# Patient Record
Sex: Male | Born: 1975
Health system: Southern US, Community
[De-identification: ages and names within clinical notes are randomized; demographics above are authoritative.]

## PROBLEM LIST (undated history)

## (undated) DIAGNOSIS — T7840XA Allergy, unspecified, initial encounter: Secondary | ICD-10-CM

## (undated) DIAGNOSIS — J189 Pneumonia, unspecified organism: Secondary | ICD-10-CM

## (undated) DIAGNOSIS — F419 Anxiety disorder, unspecified: Secondary | ICD-10-CM

## (undated) DIAGNOSIS — Z8701 Personal history of pneumonia (recurrent): Secondary | ICD-10-CM

## (undated) DIAGNOSIS — R748 Abnormal levels of other serum enzymes: Secondary | ICD-10-CM

## (undated) DIAGNOSIS — F329 Major depressive disorder, single episode, unspecified: Secondary | ICD-10-CM

## (undated) DIAGNOSIS — E119 Type 2 diabetes mellitus without complications: Secondary | ICD-10-CM

## (undated) DIAGNOSIS — C801 Malignant (primary) neoplasm, unspecified: Secondary | ICD-10-CM

## (undated) DIAGNOSIS — E785 Hyperlipidemia, unspecified: Secondary | ICD-10-CM

## (undated) DIAGNOSIS — F3181 Bipolar II disorder: Secondary | ICD-10-CM

## (undated) DIAGNOSIS — K219 Gastro-esophageal reflux disease without esophagitis: Secondary | ICD-10-CM

## (undated) DIAGNOSIS — F1021 Alcohol dependence, in remission: Secondary | ICD-10-CM

## (undated) DIAGNOSIS — J309 Allergic rhinitis, unspecified: Secondary | ICD-10-CM

## (undated) DIAGNOSIS — D649 Anemia, unspecified: Secondary | ICD-10-CM

## (undated) DIAGNOSIS — F32A Depression, unspecified: Secondary | ICD-10-CM

## (undated) DIAGNOSIS — I10 Essential (primary) hypertension: Secondary | ICD-10-CM

## (undated) HISTORY — DX: Allergy, unspecified, initial encounter: T78.40XA

## (undated) HISTORY — DX: Gastro-esophageal reflux disease without esophagitis: K21.9

## (undated) HISTORY — DX: Alcohol dependence, in remission: F10.21

## (undated) HISTORY — DX: Bipolar II disorder: F31.81

## (undated) HISTORY — DX: Anxiety disorder, unspecified: F41.9

## (undated) HISTORY — PX: SINUSOTOMY: SHX291

## (undated) HISTORY — PX: TONSILLECTOMY AND ADENOIDECTOMY: SHX28

## (undated) HISTORY — DX: Depression, unspecified: F32.A

## (undated) HISTORY — DX: Allergic rhinitis, unspecified: J30.9

## (undated) HISTORY — DX: Personal history of pneumonia (recurrent): Z87.01

## (undated) HISTORY — PX: OTHER SURGICAL HISTORY: SHX169

## (undated) HISTORY — PX: ESOPHAGOGASTRODUODENOSCOPY: SHX1529

## (undated) HISTORY — PX: WRIST FRACTURE SURGERY: SHX121

## (undated) HISTORY — DX: Hyperlipidemia, unspecified: E78.5

---
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
## 2010-11-26 DIAGNOSIS — K219 Gastro-esophageal reflux disease without esophagitis: Secondary | ICD-10-CM | POA: Insufficient documentation

## 2020-04-26 ENCOUNTER — Encounter: Payer: Self-pay | Admitting: Family Medicine

## 2020-04-27 ENCOUNTER — Other Ambulatory Visit: Payer: Self-pay

## 2020-04-28 ENCOUNTER — Ambulatory Visit: Payer: Self-pay | Admitting: Family Medicine

## 2020-04-30 ENCOUNTER — Other Ambulatory Visit: Payer: Self-pay

## 2020-04-30 ENCOUNTER — Emergency Department (HOSPITAL_COMMUNITY): Payer: BC Managed Care – PPO

## 2020-04-30 ENCOUNTER — Inpatient Hospital Stay (HOSPITAL_COMMUNITY)
Admission: EM | Admit: 2020-04-30 | Discharge: 2020-05-02 | DRG: 871 | Disposition: A | Discharge status: Home / Self Care | Payer: BC Managed Care – PPO | Source: Ambulatory Visit | Attending: Internal Medicine | Admitting: Internal Medicine

## 2020-04-30 DIAGNOSIS — N182 Chronic kidney disease, stage 2 (mild): Secondary | ICD-10-CM | POA: Diagnosis present

## 2020-04-30 DIAGNOSIS — A419 Sepsis, unspecified organism: Principal | ICD-10-CM | POA: Diagnosis present

## 2020-04-30 DIAGNOSIS — F17290 Nicotine dependence, other tobacco product, uncomplicated: Secondary | ICD-10-CM | POA: Diagnosis present

## 2020-04-30 DIAGNOSIS — E119 Type 2 diabetes mellitus without complications: Secondary | ICD-10-CM | POA: Diagnosis not present

## 2020-04-30 DIAGNOSIS — I129 Hypertensive chronic kidney disease with stage 1 through stage 4 chronic kidney disease, or unspecified chronic kidney disease: Secondary | ICD-10-CM | POA: Diagnosis present

## 2020-04-30 DIAGNOSIS — R7303 Prediabetes: Secondary | ICD-10-CM | POA: Diagnosis present

## 2020-04-30 DIAGNOSIS — R7989 Other specified abnormal findings of blood chemistry: Secondary | ICD-10-CM

## 2020-04-30 DIAGNOSIS — F419 Anxiety disorder, unspecified: Secondary | ICD-10-CM | POA: Diagnosis present

## 2020-04-30 DIAGNOSIS — J9601 Acute respiratory failure with hypoxia: Secondary | ICD-10-CM

## 2020-04-30 DIAGNOSIS — R6520 Severe sepsis without septic shock: Secondary | ICD-10-CM | POA: Diagnosis not present

## 2020-04-30 DIAGNOSIS — D649 Anemia, unspecified: Secondary | ICD-10-CM | POA: Diagnosis present

## 2020-04-30 DIAGNOSIS — K449 Diaphragmatic hernia without obstruction or gangrene: Secondary | ICD-10-CM | POA: Diagnosis present

## 2020-04-30 DIAGNOSIS — N289 Disorder of kidney and ureter, unspecified: Secondary | ICD-10-CM | POA: Diagnosis present

## 2020-04-30 DIAGNOSIS — Z888 Allergy status to other drugs, medicaments and biological substances status: Secondary | ICD-10-CM

## 2020-04-30 DIAGNOSIS — Z20822 Contact with and (suspected) exposure to covid-19: Secondary | ICD-10-CM | POA: Diagnosis present

## 2020-04-30 DIAGNOSIS — K219 Gastro-esophageal reflux disease without esophagitis: Secondary | ICD-10-CM | POA: Diagnosis present

## 2020-04-30 DIAGNOSIS — N179 Acute kidney failure, unspecified: Secondary | ICD-10-CM | POA: Diagnosis present

## 2020-04-30 DIAGNOSIS — F329 Major depressive disorder, single episode, unspecified: Secondary | ICD-10-CM | POA: Diagnosis present

## 2020-04-30 DIAGNOSIS — J189 Pneumonia, unspecified organism: Secondary | ICD-10-CM | POA: Diagnosis not present

## 2020-04-30 DIAGNOSIS — Z79899 Other long term (current) drug therapy: Secondary | ICD-10-CM

## 2020-04-30 DIAGNOSIS — J181 Lobar pneumonia, unspecified organism: Secondary | ICD-10-CM | POA: Diagnosis present

## 2020-04-30 DIAGNOSIS — I10 Essential (primary) hypertension: Secondary | ICD-10-CM | POA: Diagnosis present

## 2020-04-30 HISTORY — DX: Essential (primary) hypertension: I10

## 2020-04-30 LAB — CBC WITH DIFFERENTIAL/PLATELET
Abs Immature Granulocytes: 0.07 10*3/uL (ref 0.00–0.07)
Basophils Absolute: 0 10*3/uL (ref 0.0–0.1)
Basophils Relative: 0 %
Eosinophils Absolute: 0 10*3/uL (ref 0.0–0.5)
Eosinophils Relative: 0 %
HCT: 35.3 % — ABNORMAL LOW (ref 39.0–52.0)
Hemoglobin: 11.8 g/dL — ABNORMAL LOW (ref 13.0–17.0)
Immature Granulocytes: 1 %
Lymphocytes Relative: 5 %
Lymphs Abs: 0.7 10*3/uL (ref 0.7–4.0)
MCH: 27.9 pg (ref 26.0–34.0)
MCHC: 33.4 g/dL (ref 30.0–36.0)
MCV: 83.5 fL (ref 80.0–100.0)
Monocytes Absolute: 0.7 10*3/uL (ref 0.1–1.0)
Monocytes Relative: 5 %
Neutro Abs: 12.3 10*3/uL — ABNORMAL HIGH (ref 1.7–7.7)
Neutrophils Relative %: 89 %
Platelets: 235 10*3/uL (ref 150–400)
RBC: 4.23 MIL/uL (ref 4.22–5.81)
RDW: 15 % (ref 11.5–15.5)
WBC Morphology: INCREASED
WBC: 13.8 10*3/uL — ABNORMAL HIGH (ref 4.0–10.5)
nRBC: 0 % (ref 0.0–0.2)

## 2020-04-30 LAB — COMPREHENSIVE METABOLIC PANEL
ALT: 16 U/L (ref 0–44)
AST: 14 U/L — ABNORMAL LOW (ref 15–41)
Albumin: 4.3 g/dL (ref 3.5–5.0)
Alkaline Phosphatase: 38 U/L (ref 38–126)
Anion gap: 13 (ref 5–15)
BUN: 47 mg/dL — ABNORMAL HIGH (ref 6–20)
CO2: 27 mmol/L (ref 22–32)
Calcium: 9.8 mg/dL (ref 8.9–10.3)
Chloride: 103 mmol/L (ref 98–111)
Creatinine, Ser: 2.71 mg/dL — ABNORMAL HIGH (ref 0.61–1.24)
GFR calc Af Amer: 32 mL/min — ABNORMAL LOW (ref >60)
GFR calc non Af Amer: 27 mL/min — ABNORMAL LOW (ref >60)
Glucose, Bld: 105 mg/dL — ABNORMAL HIGH (ref 70–99)
Potassium: 3.9 mmol/L (ref 3.5–5.1)
Sodium: 143 mmol/L (ref 135–145)
Total Bilirubin: 0.8 mg/dL (ref 0.3–1.2)
Total Protein: 7.4 g/dL (ref 6.5–8.1)

## 2020-04-30 LAB — FIBRINOGEN: Fibrinogen: 596 mg/dL — ABNORMAL HIGH (ref 210–475)

## 2020-04-30 LAB — C-REACTIVE PROTEIN: CRP: 18.7 mg/dL — ABNORMAL HIGH (ref <1.0)

## 2020-04-30 LAB — SARS CORONAVIRUS 2 BY RT PCR (HOSPITAL ORDER, PERFORMED IN ~~LOC~~ HOSPITAL LAB): SARS Coronavirus 2: NEGATIVE

## 2020-04-30 LAB — TRIGLYCERIDES: Triglycerides: 39 mg/dL (ref <150)

## 2020-04-30 LAB — D-DIMER, QUANTITATIVE: D-Dimer, Quant: 0.9 ug/mL-FEU — ABNORMAL HIGH (ref 0.00–0.50)

## 2020-04-30 LAB — PROCALCITONIN: Procalcitonin: 1.05 ng/mL

## 2020-04-30 LAB — LACTATE DEHYDROGENASE: LDH: 112 U/L (ref 98–192)

## 2020-04-30 LAB — LACTIC ACID, PLASMA
Lactic Acid, Venous: 1.5 mmol/L (ref 0.5–1.9)
Lactic Acid, Venous: 1.1 mmol/L (ref 0.5–1.9)

## 2020-04-30 LAB — FERRITIN: Ferritin: 48 ng/mL (ref 24–336)

## 2020-04-30 MED ORDER — LACTATED RINGERS IV BOLUS (SEPSIS)
1000.0000 mL | Freq: Once | INTRAVENOUS | Status: AC
  Administered 2020-04-30: 1000 mL via INTRAVENOUS

## 2020-04-30 MED ORDER — LACTATED RINGERS IV BOLUS (SEPSIS): 500.0000 mL | Freq: Once | INTRAVENOUS | Status: DC

## 2020-04-30 MED ORDER — SODIUM CHLORIDE 0.9 % IV SOLN
500.0000 mg | INTRAVENOUS | Status: DC
  Administered 2020-04-30 – 2020-05-01 (×2): 500 mg via INTRAVENOUS
  Filled 2020-04-30 (×3): qty 500

## 2020-04-30 MED ORDER — DEXAMETHASONE SODIUM PHOSPHATE 10 MG/ML IJ SOLN
10.0000 mg | Freq: Once | INTRAMUSCULAR | Status: AC
  Administered 2020-04-30: 10 mg via INTRAVENOUS
  Filled 2020-04-30: qty 1

## 2020-04-30 MED ORDER — LORAZEPAM 2 MG/ML IJ SOLN
0.5000 mg | Freq: Once | INTRAMUSCULAR | Status: AC
  Administered 2020-04-30: 0.5 mg via INTRAVENOUS
  Filled 2020-04-30: qty 1

## 2020-04-30 MED ORDER — KETOROLAC TROMETHAMINE 15 MG/ML IJ SOLN
15.0000 mg | Freq: Once | INTRAMUSCULAR | Status: AC
  Administered 2020-04-30: 15 mg via INTRAVENOUS
  Filled 2020-04-30: qty 1

## 2020-04-30 MED ORDER — SODIUM CHLORIDE 0.9 % IV BOLUS
500.0000 mL | Freq: Once | INTRAVENOUS | Status: AC
  Administered 2020-04-30: 500 mL via INTRAVENOUS

## 2020-04-30 MED ORDER — SODIUM CHLORIDE 0.9 % IV BOLUS
1000.0000 mL | Freq: Once | INTRAVENOUS | Status: AC
  Administered 2020-04-30: 1000 mL via INTRAVENOUS

## 2020-04-30 MED ORDER — SODIUM CHLORIDE 0.9 % IV SOLN
2.0000 g | INTRAVENOUS | Status: DC
  Administered 2020-04-30 – 2020-05-01 (×2): 2 g via INTRAVENOUS
  Filled 2020-04-30: qty 20
  Filled 2020-04-30: qty 2
  Filled 2020-04-30: qty 20

## 2020-04-30 MED ORDER — LACTATED RINGERS IV BOLUS (SEPSIS)
1000.0000 mL | Freq: Once | INTRAVENOUS | Status: AC
  Administered 2020-05-01: 1000 mL via INTRAVENOUS

## 2020-04-30 NOTE — ED Provider Notes (Signed)
Kyle [name]   CSN: [id number] Arrival date & time: 04/30/20  1954     History No chief complaint on file.   Kyle [name] is a 44 y.o. male with history of GERD, hiatal hernia, hyperlipidemia, hypertension and prediabetes per his report presents brought in by EMS for evaluation of fever, cough, myalgias, generalized weakness.  When asked what brought him here he begins to tell me about his longstanding history with GERD related to peptic ulcer disease and hiatal hernia for which she has been told he needs Nissen fundoplication.  He states that it is not unusual for him to cough to the point that he vomits.  He does not remember going to the urgent care today but does tell me that his girlfriend told him that he had a fever of 101.2 F today.  He states that yesterday in the morning he overall felt well and spent time with his children, lay down for a nap and when he awoke "I had no energy and I was wiped out".  Per triage [name], the patient went to an urgent care today for nonproductive cough, nausea, and vomiting.  His O2 saturations dropped to 72% on room air.  Supplemental oxygen via nasal cannula was applied and he was transported to the ED for further evaluation and management.  The patient tells me that last Sunday he received the first dose of his Materna vaccine.  He has a longstanding history of tobacco use, used to smoke then switched to dip and now vapes.  The history is provided by the patient and the EMS personnel. The history is limited by the condition of the patient.       No past medical history on file.  There are no problems to display for this patient.   No family history on file.  Social History   Tobacco Use  . Smoking status: Not on file  Substance Use Topics  . Alcohol use: Not on file  . Drug use: Not on file    Home Medications Prior to Admission medications   Medication Sig Start Date End Date Taking?  Authorizing Provider  clonazePAM (KLONOPIN) 1 MG tablet Take 1 mg by mouth 2 (two) times daily. 04/28/20  Yes Provider, Historical, Kyle [name]  fenofibrate 160 MG tablet Take 160 mg by mouth daily. 04/13/20  Yes Provider, Historical, Kyle [name]  lisinopril-hydrochlorothiazide (ZESTORETIC) 10-12.5 MG tablet Take 1 tablet by mouth daily. 04/02/20  Yes Provider, Historical, Kyle [name]  pantoprazole (PROTONIX) 40 MG tablet Take 40 mg by mouth daily. 04/02/20  Yes Provider, Historical, Kyle [name]  QUEtiapine (SEROQUEL XR) 50 MG TB24 24 hr tablet Take 3 tablets by mouth at bedtime. 04/24/20  Yes Provider, Historical, Kyle [name]  venlafaxine XR (EFFEXOR-XR) 75 MG 24 hr capsule Take 3 capsules by mouth daily. 04/28/20  Yes Provider, Historical, Kyle [name]    Allergies    Benzonatate  Review of Systems   Review of Systems  Constitutional: Positive for chills, fatigue and fever.  Respiratory: Positive for cough and shortness of breath.   Gastrointestinal: Positive for nausea and vomiting. Negative for abdominal pain, constipation and diarrhea.  All other systems reviewed and are negative.   Physical Exam Updated Vital Signs BP (!) 86/64 (BP Location: Left Arm)   Pulse (!) 103   Temp 98.3 F (36.8 C) (Oral)   Resp (!) 30   Ht 6\' 2"  (1.88 m)   Wt 108.9 kg   SpO2 99%   BMI 30.81 kg/m  Physical Exam Vitals and nursing [name] reviewed.  Constitutional:      Appearance: He is well-developed. He is ill-appearing.  HENT:     Head: Normocephalic and atraumatic.  Eyes:     General:        Right eye: No discharge.        Left eye: No discharge.     Conjunctiva/sclera: Conjunctivae normal.  Neck:     Vascular: No JVD.     Trachea: No tracheal deviation.  Cardiovascular:     Rate and Rhythm: Regular rhythm. Tachycardia present.  Pulmonary:     Comments: Tachypneic.  Diffuse Rales.  SPO2 saturations 97% on 3.5LPM [place] Abdominal:     General: Bowel sounds are normal. There is no distension.     Palpations: Abdomen is soft.     Tenderness: There is  no abdominal tenderness. There is no guarding or rebound.  Musculoskeletal:     Cervical back: Neck supple. No rigidity or tenderness.  Skin:    General: Skin is warm.     Findings: No erythema.  Neurological:     Mental Status: He is alert.     Comments: Appears mildly confused, slow to answer questions.  No facial droop.  Moves all extremities spontaneous without difficulty.  Psychiatric:        Behavior: Behavior normal.     ED Results / Procedures / Treatments   Labs (all labs ordered are listed, but only abnormal results are displayed) Labs Reviewed  CBC WITH DIFFERENTIAL/PLATELET - Abnormal; Notable for the following components:      Result Value   WBC 13.8 (*)    Hemoglobin 11.8 (*)    HCT 35.3 (*)    Neutro Abs 12.3 (*)    All other components within normal limits  COMPREHENSIVE METABOLIC PANEL - Abnormal; Notable for the following components:   Glucose, Bld 105 (*)    BUN 47 (*)    Creatinine, Ser 2.71 (*)    AST 14 (*)    GFR calc non Af Amer 27 (*)    GFR calc Af Amer 32 (*)    All other components within normal limits  D-DIMER, QUANTITATIVE (NOT AT [facility]) - Abnormal; Notable for the following components:   D-Dimer, Quant 0.90 (*)    All other components within normal limits  FIBRINOGEN - Abnormal; Notable for the following components:   Fibrinogen 596 (*)    All other components within normal limits  SARS CORONAVIRUS 2 BY RT PCR (HOSPITAL ORDER, [facility])  CULTURE, BLOOD (ROUTINE X 2)  CULTURE, BLOOD (ROUTINE X 2)  LACTIC ACID, PLASMA  LACTATE DEHYDROGENASE  TRIGLYCERIDES  LACTIC ACID, PLASMA  PROCALCITONIN  FERRITIN  C-REACTIVE PROTEIN    EKG EKG Interpretation  Date/Time:  Friday April 30 2020 20:11:12 EDT Ventricular Rate:  107 PR Interval:    QRS Duration: 105 QT Interval:  323 QTC Calculation: 431 R Axis:   100 Text Interpretation: Sinus tachycardia Right axis deviation No STEMI Confirmed by Kyle [name]  [phone]) on 04/30/2020 9:03:11 PM   Radiology DG Chest Port 1 View  Result Date: 04/30/2020 CLINICAL DATA:  Shortness of breath and cough EXAM: PORTABLE CHEST 1 VIEW COMPARISON:  None. FINDINGS: Right lung is grossly clear. Patchy consolidations and ground-glass densities in the left mid to lower lung. Normal heart size. No pneumothorax. IMPRESSION: Patchy consolidations and ground-glass densities in the left mid to lower lung suspicious for pneumonia. Electronically Signed   By: Kyle Mercury  Francoise Ceo M.D.   On: 04/30/2020 20:52    Procedures .Critical Care Performed by: Kyle Papa, Kyle [name] Authorized by: Kyle Papa, Kyle [name]   Critical care provider statement:    Critical care time (minutes):  45   Critical care was necessary to treat or prevent imminent or life-threatening deterioration of the following conditions:  Respiratory failure   Critical care was time spent personally by me on the following activities:  Discussions with consultants, evaluation of patient's response to treatment, examination of patient, ordering and performing treatments and interventions, ordering and review of laboratory studies, ordering and review of radiographic studies, pulse oximetry, re-evaluation of patient's condition, obtaining history from patient or surrogate and review of old charts   (including critical care time)  Medications Ordered in ED Medications  lactated ringers bolus 1,000 mL (has no administration in time range)    And  lactated ringers bolus 1,000 mL (has no administration in time range)    And  lactated ringers bolus 1,000 mL (has no administration in time range)    And  lactated ringers bolus 500 mL (has no administration in time range)  cefTRIAXone (ROCEPHIN) 2 g in sodium chloride 0.9 % 100 mL IVPB (has no administration in time range)  azithromycin (ZITHROMAX) 500 mg in sodium chloride 0.9 % 250 mL IVPB (has no administration in time range)  sodium chloride 0.9 % bolus 500 mL (0 mLs  Intravenous Stopped 04/30/20 2201)  dexamethasone (DECADRON) injection 10 mg (10 mg Intravenous Given 04/30/20 2117)  LORazepam (ATIVAN) injection 0.5 mg (0.5 mg Intravenous Given 04/30/20 2158)  sodium chloride 0.9 % bolus 1,000 mL (1,000 mLs Intravenous New Bag/Given 04/30/20 2157)  ketorolac (TORADOL) 15 MG/ML injection 15 mg (15 mg Intravenous Given 04/30/20 2200)    ED Course  I have reviewed the triage vital signs and the nursing notes.  Pertinent labs & imaging results that were available during my care of the patient were reviewed by me and considered in my medical decision making (see chart for details).  Clinical Course as of Apr 30 2224  Fri Apr 30, 2020  2140 This is a 44 year old male is presenting from urgent care with concern for hypoxia and flulike illness.  The patient is a poor historian and cannot provide specifics for his illness.  He tells me that he has "felt like this way for years".  He said he went to the urgent care today because "I was tired of feeling like crap".  He denies any recent change in his symptoms.  At urgent care he was found to be hypoxic requiring 2 L nasal cannula.  His Covid test was "inconclusive".  He was advised to come to the ER.  Her exam here is stable on 2 L nasal cannula.  His respiratory rate is in the high 20s.  His heart rate is mildly tachycardic.  Xray shows bilateral PNA pattern consistent with covid. He is not vaccinated and I do feel this presentation is very consistent with covid.  PCR ordered.  We'll start fluids here and given decadron.  If covid is negative he'll need a full sepsis workup   [MT]    Clinical Course User Index [MT] Kyle [name], Kyle Rhine, Kyle [name]   MDM Rules/Calculators/A&P                          Kyle [name] was evaluated in Emergency Department on 04/30/2020 for the symptoms described in the history of present illness.  He was evaluated in the context of the global COVID-19 pandemic, which necessitated consideration that the patient  might be at risk for infection with the SARS-CoV-2 virus that causes COVID-19. Institutional protocols and algorithms that pertain to the evaluation of patients at risk for COVID-19 are in a state of rapid change based on information released by regulatory bodies including the CDC and federal and state organizations. These policies and algorithms were followed during the patient's care in the ED.  Patient presenting for evaluation of fever, myalgias, nausea, vomiting, cough.  Afebrile in the ED, tachycardic, tachypneic, and borderline hypotensive.  He appears ill.  Prior to this he was at urgent care where he was noted to be hypoxic and EMS transported him to the ED for further evaluation.  SPO2 saturations are stable on 3.5 L/min via nasal cannula.  Lungs with diffuse Rales concerning for possible pneumonia.  He got the first dose of his maternal vaccine last week.  He is a smoker and has multiple comorbidities.  Chest x-ray shows patchy consolidations and groundglass densities in the left mid to lower lung suspicious for pneumonia.  History and physical examination concerning for COVID-19 infection.  Will obtain lab work and Kyle [name] test.  10:00PM Signed out care to oncoming provider PA Kyle [name]; CBC shows leukocytosis, mild anemia, CMP shows renal insufficiency though unclear chronicity as the patient recently moved from [place] 1 month ago and we have no lab work in our system to compare to.  No metabolic derangements.  If Covid test is negative patient will likely need sepsis work-up, 30 cc/kg fluid bolus and IV antibiotics.  Patient was seen and evaluated by Kyle [name] who agrees with assessment and plan at this time.    Final Clinical Impression(s) / ED Diagnoses Final diagnoses:  Acute respiratory failure with hypoxia [facility])  Renal insufficiency  Multifocal pneumonia    Rx / DC Orders ED Discharge Orders    None       Debroah Baller 04/30/20 2229    Wyvonnia Dusky,  Kyle [name] 05/01/20 [phone]

## 2020-04-30 NOTE — ED Triage Notes (Signed)
Pt arrives EMS after going to UC for non productive cough, nausea and vomiting. Pt received a rapid covid test that resulted inconclusive. Per UC pt oxygen saturation dropped to 72% on room air. Applied 2L [place] and it improved to 90%.

## 2020-04-30 NOTE — ED Notes (Signed)
Patient states that he is here for nausea, vomiting and a cough.

## 2020-04-30 NOTE — ED Notes (Signed)
Family updated as to patient's status.

## 2020-04-30 NOTE — ED Provider Notes (Signed)
Assumed care from PA [facility] at shift change.  See prior notes for full H&P.  Briefly, 44 y.o. M here for cough, fever, myalgias, and generalized weakness.  He went to UC earlier today and sats were 72% on RA.  He has been on 3.5L here in the ED and maintaining into 90's.  He is not generally O2 dependent.  First dose of moderna given.   CXR with findings of multifocal pneumonia.  Plan:  If covid negative, will start abx.  Will require admission either way given O2 requirement.  10:24 PM covid is negative.  Code sepsis has been activated as patient does remain hypotensive after initial IVFB.  Last BP reading 86/64, HR 103.  He was started on rocephin and azithromycin, give 30cc/kg bolus.  11:04 PM BP improving with IVF, around 670 systolic now.  Will admit to hospitalist service.  Discussed with Dr. Hal Hope-- he will admit for ongoing care.  CRITICAL CARE Performed by: Larene Pickett   Total critical care time: 35 minutes  Critical care time was exclusive of separately billable procedures and treating other patients.  Critical care was necessary to treat or prevent imminent or life-threatening deterioration.  Critical care was time spent personally by me on the following activities: development of treatment plan with patient and/or surrogate as well as nursing, discussions with consultants, evaluation of patient's response to treatment, examination of patient, obtaining history from patient or surrogate, ordering and performing treatments and interventions, ordering and review of laboratory studies, ordering and review of radiographic studies, pulse oximetry and re-evaluation of patient's condition.    Larene Pickett, PA-C 05/01/20 1410    Wyvonnia Dusky, MD 05/01/20 [phone]

## 2020-05-01 ENCOUNTER — Encounter (HOSPITAL_COMMUNITY): Payer: Self-pay | Admitting: Internal Medicine

## 2020-05-01 ENCOUNTER — Inpatient Hospital Stay (HOSPITAL_COMMUNITY): Payer: BC Managed Care – PPO

## 2020-05-01 DIAGNOSIS — R6520 Severe sepsis without septic shock: Secondary | ICD-10-CM

## 2020-05-01 DIAGNOSIS — N179 Acute kidney failure, unspecified: Secondary | ICD-10-CM

## 2020-05-01 DIAGNOSIS — I10 Essential (primary) hypertension: Secondary | ICD-10-CM | POA: Diagnosis present

## 2020-05-01 DIAGNOSIS — J189 Pneumonia, unspecified organism: Secondary | ICD-10-CM

## 2020-05-01 DIAGNOSIS — J9601 Acute respiratory failure with hypoxia: Secondary | ICD-10-CM

## 2020-05-01 DIAGNOSIS — J181 Lobar pneumonia, unspecified organism: Secondary | ICD-10-CM

## 2020-05-01 DIAGNOSIS — A419 Sepsis, unspecified organism: Secondary | ICD-10-CM | POA: Diagnosis present

## 2020-05-01 LAB — URINALYSIS, ROUTINE W REFLEX MICROSCOPIC
Bilirubin Urine: NEGATIVE
Glucose, UA: 150 mg/dL — AB
Hgb urine dipstick: NEGATIVE
Ketones, ur: NEGATIVE mg/dL
Leukocytes,Ua: NEGATIVE
Nitrite: NEGATIVE
Protein, ur: NEGATIVE mg/dL
Specific Gravity, Urine: 1.014 (ref 1.005–1.030)
pH: 5 (ref 5.0–8.0)

## 2020-05-01 LAB — RESPIRATORY PANEL BY PCR

## 2020-05-01 LAB — COMPREHENSIVE METABOLIC PANEL
ALT: 20 U/L (ref 0–44)
AST: 18 U/L (ref 15–41)
Albumin: 3.3 g/dL — ABNORMAL LOW (ref 3.5–5.0)
Alkaline Phosphatase: 30 U/L — ABNORMAL LOW (ref 38–126)
Anion gap: 11 (ref 5–15)
BUN: 39 mg/dL — ABNORMAL HIGH (ref 6–20)
CO2: 20 mmol/L — ABNORMAL LOW (ref 22–32)
Calcium: 8.1 mg/dL — ABNORMAL LOW (ref 8.9–10.3)
Chloride: 105 mmol/L (ref 98–111)
Creatinine, Ser: 2.3 mg/dL — ABNORMAL HIGH (ref 0.61–1.24)
GFR calc Af Amer: 39 mL/min — ABNORMAL LOW (ref >60)
GFR calc non Af Amer: 33 mL/min — ABNORMAL LOW (ref >60)
Glucose, Bld: 185 mg/dL — ABNORMAL HIGH (ref 70–99)
Potassium: 4.3 mmol/L (ref 3.5–5.1)
Sodium: 136 mmol/L (ref 135–145)
Total Bilirubin: 0.3 mg/dL (ref 0.3–1.2)
Total Protein: 6.4 g/dL — ABNORMAL LOW (ref 6.5–8.1)

## 2020-05-01 LAB — CBC WITH DIFFERENTIAL/PLATELET
Abs Immature Granulocytes: 0.03 10*3/uL (ref 0.00–0.07)
Abs Immature Granulocytes: 0.04 10*3/uL (ref 0.00–0.07)
Abs Immature Granulocytes: 0.05 10*3/uL (ref 0.00–0.07)
Basophils Absolute: 0 10*3/uL (ref 0.0–0.1)
Basophils Absolute: 0 10*3/uL (ref 0.0–0.1)
Basophils Absolute: 0 10*3/uL (ref 0.0–0.1)
Basophils Relative: 0 %
Basophils Relative: 0 %
Basophils Relative: 0 %
Eosinophils Absolute: 0 10*3/uL (ref 0.0–0.5)
Eosinophils Absolute: 0 10*3/uL (ref 0.0–0.5)
Eosinophils Absolute: 0 10*3/uL (ref 0.0–0.5)
Eosinophils Relative: 0 %
Eosinophils Relative: 0 %
Eosinophils Relative: 0 %
HCT: 30.9 % — ABNORMAL LOW (ref 39.0–52.0)
HCT: 31.1 % — ABNORMAL LOW (ref 39.0–52.0)
HCT: 31.4 % — ABNORMAL LOW (ref 39.0–52.0)
Hemoglobin: 10.1 g/dL — ABNORMAL LOW (ref 13.0–17.0)
Hemoglobin: 10.3 g/dL — ABNORMAL LOW (ref 13.0–17.0)
Hemoglobin: 10.2 g/dL — ABNORMAL LOW (ref 13.0–17.0)
Immature Granulocytes: 0 %
Immature Granulocytes: 0 %
Immature Granulocytes: 0 %
Lymphocytes Relative: 3 %
Lymphocytes Relative: 4 %
Lymphocytes Relative: 5 %
Lymphs Abs: 0.5 10*3/uL — ABNORMAL LOW (ref 0.7–4.0)
Lymphs Abs: 0.5 10*3/uL — ABNORMAL LOW (ref 0.7–4.0)
Lymphs Abs: 0.8 10*3/uL (ref 0.7–4.0)
MCH: 27.8 pg (ref 26.0–34.0)
MCH: 27.9 pg (ref 26.0–34.0)
MCH: 27.5 pg (ref 26.0–34.0)
MCHC: 32.7 g/dL (ref 30.0–36.0)
MCHC: 33.1 g/dL (ref 30.0–36.0)
MCHC: 32.5 g/dL (ref 30.0–36.0)
MCV: 85.1 fL (ref 80.0–100.0)
MCV: 84.3 fL (ref 80.0–100.0)
MCV: 84.6 fL (ref 80.0–100.0)
Monocytes Absolute: 0.2 10*3/uL (ref 0.1–1.0)
Monocytes Absolute: 0.3 10*3/uL (ref 0.1–1.0)
Monocytes Absolute: 0.9 10*3/uL (ref 0.1–1.0)
Monocytes Relative: 2 %
Monocytes Relative: 2 %
Monocytes Relative: 6 %
Neutro Abs: 13.1 10*3/uL — ABNORMAL HIGH (ref 1.7–7.7)
Neutro Abs: 12.6 10*3/uL — ABNORMAL HIGH (ref 1.7–7.7)
Neutro Abs: 13.6 10*3/uL — ABNORMAL HIGH (ref 1.7–7.7)
Neutrophils Relative %: 95 %
Neutrophils Relative %: 94 %
Neutrophils Relative %: 89 %
Platelets: 212 10*3/uL (ref 150–400)
Platelets: 211 10*3/uL (ref 150–400)
Platelets: 230 10*3/uL (ref 150–400)
RBC: 3.63 MIL/uL — ABNORMAL LOW (ref 4.22–5.81)
RBC: 3.69 MIL/uL — ABNORMAL LOW (ref 4.22–5.81)
RBC: 3.71 MIL/uL — ABNORMAL LOW (ref 4.22–5.81)
RDW: 15.1 % (ref 11.5–15.5)
RDW: 15 % (ref 11.5–15.5)
RDW: 15.1 % (ref 11.5–15.5)
WBC: 13.9 10*3/uL — ABNORMAL HIGH (ref 4.0–10.5)
WBC: 13.4 10*3/uL — ABNORMAL HIGH (ref 4.0–10.5)
WBC: 15.3 10*3/uL — ABNORMAL HIGH (ref 4.0–10.5)
nRBC: 0 % (ref 0.0–0.2)
nRBC: 0 % (ref 0.0–0.2)
nRBC: 0 % (ref 0.0–0.2)

## 2020-05-01 LAB — HIV ANTIBODY (ROUTINE TESTING W REFLEX): HIV Screen 4th Generation wRfx: NONREACTIVE

## 2020-05-01 LAB — CREATININE, URINE, RANDOM: Creatinine, Urine: 169.67 mg/dL

## 2020-05-01 LAB — LIPASE, BLOOD: Lipase: 22 U/L (ref 11–51)

## 2020-05-01 LAB — MRSA PCR SCREENING: MRSA by PCR: NEGATIVE

## 2020-05-01 LAB — SODIUM, URINE, RANDOM: Sodium, Ur: 29 mmol/L

## 2020-05-01 LAB — PROTIME-INR
INR: 1.3 — ABNORMAL HIGH (ref 0.8–1.2)
Prothrombin Time: 15.3 seconds — ABNORMAL HIGH (ref 11.4–15.2)

## 2020-05-01 LAB — APTT: aPTT: 31 seconds (ref 24–36)

## 2020-05-01 LAB — SARS CORONAVIRUS 2 BY RT PCR (HOSPITAL ORDER, PERFORMED IN ~~LOC~~ HOSPITAL LAB): SARS Coronavirus 2: NEGATIVE

## 2020-05-01 LAB — STREP PNEUMONIAE URINARY ANTIGEN: Strep Pneumo Urinary Antigen: NEGATIVE

## 2020-05-01 MED ORDER — LORATADINE 10 MG PO TABS: 10.0000 mg | ORAL_TABLET | Freq: Every day | ORAL | Status: DC

## 2020-05-01 MED ORDER — VENLAFAXINE HCL ER 75 MG PO CP24
225.0000 mg | ORAL_CAPSULE | Freq: Every day | ORAL | Status: DC
  Administered 2020-05-01: 225 mg via ORAL
  Filled 2020-05-01: qty 1

## 2020-05-01 MED ORDER — SODIUM CHLORIDE 0.9 % IV SOLN: INTRAVENOUS | Status: DC

## 2020-05-01 MED ORDER — CLONAZEPAM 1 MG PO TABS: 1.0000 mg | ORAL_TABLET | Freq: Two times a day (BID) | ORAL | Status: DC

## 2020-05-01 MED ORDER — QUETIAPINE FUMARATE ER 50 MG PO TB24: 150.0000 mg | ORAL_TABLET | Freq: Every day | ORAL | Status: DC

## 2020-05-01 MED ORDER — CHLORHEXIDINE GLUCONATE CLOTH 2 % EX PADS: 6.0000 | MEDICATED_PAD | Freq: Every day | CUTANEOUS | Status: DC

## 2020-05-01 MED ORDER — ONDANSETRON HCL 4 MG/2ML IJ SOLN: 4.0000 mg | Freq: Four times a day (QID) | INTRAMUSCULAR | Status: DC | PRN

## 2020-05-01 MED ORDER — GUAIFENESIN ER 600 MG PO TB12
600.0000 mg | ORAL_TABLET | Freq: Two times a day (BID) | ORAL | Status: DC
  Administered 2020-05-01 (×2): 600 mg via ORAL
  Filled 2020-05-01 (×3): qty 1

## 2020-05-01 MED ORDER — ONDANSETRON HCL 4 MG PO TABS: 4.0000 mg | ORAL_TABLET | Freq: Four times a day (QID) | ORAL | Status: DC | PRN

## 2020-05-01 MED ORDER — SODIUM BICARBONATE 650 MG PO TABS
650.0000 mg | ORAL_TABLET | Freq: Two times a day (BID) | ORAL | Status: DC
  Administered 2020-05-01 – 2020-05-02 (×2): 650 mg via ORAL
  Filled 2020-05-01 (×3): qty 1

## 2020-05-01 MED ORDER — PANTOPRAZOLE SODIUM 40 MG PO TBEC
40.0000 mg | DELAYED_RELEASE_TABLET | Freq: Every day | ORAL | Status: DC
  Administered 2020-05-01 – 2020-05-02 (×2): 40 mg via ORAL
  Filled 2020-05-01 (×2): qty 1

## 2020-05-01 MED ORDER — HYDRALAZINE HCL 20 MG/ML IJ SOLN
10.0000 mg | INTRAMUSCULAR | Status: DC | PRN
  Administered 2020-05-01 (×2): 10 mg via INTRAVENOUS
  Filled 2020-05-01 (×2): qty 1

## 2020-05-01 MED ORDER — QUETIAPINE FUMARATE ER 50 MG PO TB24
150.0000 mg | ORAL_TABLET | Freq: Every day | ORAL | Status: DC
  Administered 2020-05-01: 22:00:00 150 mg via ORAL
  Filled 2020-05-01 (×2): qty 3

## 2020-05-01 MED ORDER — VENLAFAXINE HCL ER 75 MG PO CP24: 225.0000 mg | ORAL_CAPSULE | Freq: Every day | ORAL | Status: DC

## 2020-05-01 MED ORDER — CLONAZEPAM 1 MG PO TABS
1.0000 mg | ORAL_TABLET | Freq: Two times a day (BID) | ORAL | Status: DC
  Administered 2020-05-01 – 2020-05-02 (×3): 1 mg via ORAL
  Filled 2020-05-01 (×3): qty 1

## 2020-05-01 MED ORDER — SUCRALFATE 1 G PO TABS
1.0000 g | ORAL_TABLET | Freq: Three times a day (TID) | ORAL | Status: DC
  Administered 2020-05-01 – 2020-05-02 (×2): 1 g via ORAL
  Filled 2020-05-01 (×2): qty 1

## 2020-05-01 MED ORDER — ENOXAPARIN SODIUM 40 MG/0.4ML ~~LOC~~ SOLN
40.0000 mg | SUBCUTANEOUS | Status: DC
  Administered 2020-05-01: 40 mg via SUBCUTANEOUS
  Filled 2020-05-01: qty 0.4

## 2020-05-01 MED ORDER — LACTATED RINGERS IV SOLN: INTRAVENOUS | Status: DC

## 2020-05-01 MED ORDER — CARVEDILOL 3.125 MG PO TABS
3.1250 mg | ORAL_TABLET | Freq: Two times a day (BID) | ORAL | Status: DC
  Administered 2020-05-01 – 2020-05-02 (×2): 3.125 mg via ORAL
  Filled 2020-05-01 (×2): qty 1

## 2020-05-01 MED ORDER — FENOFIBRATE 160 MG PO TABS
160.0000 mg | ORAL_TABLET | Freq: Every day | ORAL | Status: DC
  Administered 2020-05-01 – 2020-05-02 (×2): 160 mg via ORAL
  Filled 2020-05-01 (×2): qty 1

## 2020-05-01 MED ORDER — LORATADINE 10 MG PO TABS
10.0000 mg | ORAL_TABLET | Freq: Every day | ORAL | Status: DC
  Administered 2020-05-02: 10 mg via ORAL
  Filled 2020-05-01: qty 1

## 2020-05-01 NOTE — Plan of Care (Signed)
  Problem: Clinical Measurements: Goal: Cardiovascular complication will be avoided Outcome: Progressing   

## 2020-05-01 NOTE — H&P (Signed)
History and Physical    Vanden Fawaz FXT:[id number] DOB: Jun 24, 1976 DOA: 04/30/2020  PCP: Libby Maw, MD  Patient coming from: Home.  Chief Complaint: Shortness of breath nausea vomiting.  HPI: Kyle [name] is a 44 y.o. male with history of hypertension and depression was experiencing intractable nausea vomiting for the last 24 hours and went to check on this to the urgent care over there patient was hypoxic.  Patient also subsequently started having shortness of breath with some cough.  Denies chest pain.  Denies abdominal pain or diarrhea.  Had no fever or chills.  No recent change in medications or recent travel.  ED Course: In the ER patient was hypotensive with blood pressure in the 80s.  Was requiring 2 L oxygen to maintain sats.  Patient was given fluid bolus for sepsis protocol and blood cultures obtained and started on antibiotics for possible pneumonia since chest x-ray was showing infiltrates.  Patient's labs show CRP of 18.7 procalcitonin 1.05 creatinine of 2.7 no old 1 to compare.  Hemoglobin 11.8.  WBC count of 13.8.  LFTs are normal.  Abdomen appears benign.  Patient admitted for developing sepsis likely from pneumonia.  Review of Systems: As per HPI, rest all negative.   Past Medical History:  Diagnosis Date  . Hypertension     History reviewed. No pertinent surgical history.   reports that he has never smoked. He has never used smokeless tobacco. No history on file for alcohol use and drug use.  Allergies  Allergen Reactions  . Benzonatate Other (See Comments)    Family History  Family history unknown: Yes    Prior to Admission medications   Medication Sig Start Date End Date Taking? Authorizing Provider  clonazePAM (KLONOPIN) 1 MG tablet Take 1 mg by mouth 2 (two) times daily. 04/28/20  Yes [provider]  fenofibrate 160 MG tablet Take 160 mg by mouth daily. 04/13/20  Yes [provider]  lisinopril-hydrochlorothiazide (ZESTORETIC)  10-12.5 MG tablet Take 1 tablet by mouth daily. 04/02/20  Yes [provider]  pantoprazole (PROTONIX) 40 MG tablet Take 40 mg by mouth daily. 04/02/20  Yes [provider]  QUEtiapine (SEROQUEL XR) 50 MG TB24 24 hr tablet Take 3 tablets by mouth at bedtime. 04/24/20  Yes [provider]  venlafaxine XR (EFFEXOR-XR) 75 MG 24 hr capsule Take 3 capsules by mouth daily. 04/28/20  Yes [provider]    Physical Exam: Constitutional: Moderately built and nourished. Vitals:   04/30/20 2301 04/30/20 2317 05/01/20 0003 05/01/20 0058  BP: (!) 99/57 (!) 99/57 (!) 97/54 (!) 96/55  Pulse: 93 97 88 81  Resp: 20  18 18   Temp:    (!) 97.3 F (36.3 C)  TempSrc:    Oral  SpO2:   94% 95%  Weight:      Height:       Eyes: Anicteric no pallor. ENMT: No discharge from the ears eyes nose or mouth. Neck: No mass felt.  No neck rigidity. Respiratory: No rhonchi or crepitations. Cardiovascular: S1-S2 heard. Abdomen: Soft nontender bowel sounds present. Musculoskeletal: No edema.  No joint effusion. Skin: No rash. Neurologic: Alert awake oriented to time place and person.  Moves all extremities. Psychiatric: Appears normal.  Normal affect.   Labs on Admission: I have personally reviewed following labs and imaging studies  CBC: Recent Labs  Lab 04/30/20 2055  WBC 13.8*  NEUTROABS 12.3*  HGB 11.8*  HCT 35.3*  MCV 83.5  PLT 235  Basic Metabolic Panel: Recent Labs  Lab 04/30/20 2055  NA 143  K 3.9  CL 103  CO2 27  GLUCOSE 105*  BUN 47*  CREATININE 2.71*  CALCIUM 9.8   GFR: Estimated Creatinine Clearance: 45.7 mL/min (A) (by C-G formula based on SCr of 2.71 mg/dL (H)). Liver Function Tests: Recent Labs  Lab 04/30/20 2055  AST 14*  ALT 16  ALKPHOS 38  BILITOT 0.8  PROT 7.4  ALBUMIN 4.3   No results for input(s): LIPASE, AMYLASE in the last 168 hours. No results for input(s): AMMONIA in the last 168 hours. Coagulation Profile: No results for  input(s): INR, PROTIME in the last 168 hours. Cardiac Enzymes: No results for input(s): CKTOTAL, CKMB, CKMBINDEX, TROPONINI in the last 168 hours. BNP (last 3 results) No results for input(s): PROBNP in the last 8760 hours. HbA1C: No results for input(s): HGBA1C in the last 72 hours. CBG: No results for input(s): GLUCAP in the last 168 hours. Lipid Profile: Recent Labs    04/30/20 2055  TRIG 39   Thyroid Function Tests: No results for input(s): TSH, T4TOTAL, FREET4, T3FREE, THYROIDAB in the last 72 hours. Anemia Panel: Recent Labs    04/30/20 2055  FERRITIN 48   Urine analysis: No results found for: COLORURINE, APPEARANCEUR, LABSPEC, PHURINE, GLUCOSEU, HGBUR, BILIRUBINUR, KETONESUR, PROTEINUR, UROBILINOGEN, NITRITE, LEUKOCYTESUR Sepsis Labs: @LABRCNTIP (procalcitonin:4,lacticidven:4) ) Recent Results (from the past 240 hour(s))  SARS Coronavirus 2 by RT PCR (hospital order, performed in [facility] hospital lab) Nasopharyngeal Nasopharyngeal Swab     Status: None   Collection Time: 04/30/20  8:36 PM   Specimen: Nasopharyngeal Swab  Result Value Ref Range Status   SARS Coronavirus 2 NEGATIVE NEGATIVE Final    Comment: (NOTE) SARS-CoV-2 target nucleic acids are NOT DETECTED.  The SARS-CoV-2 RNA is generally detectable in upper and lower respiratory specimens during the acute phase of infection. The lowest concentration of SARS-CoV-2 viral copies this assay can detect is 250 copies / mL. A negative result does not preclude SARS-CoV-2 infection and should not be used as the sole basis for treatment or other patient management decisions.  A negative result may occur with improper specimen collection / handling, submission of specimen other than nasopharyngeal swab, presence of viral mutation(s) within the areas targeted by this assay, and inadequate number of viral copies (<250 copies / mL). A negative result must be combined with clinical observations, patient history, and  epidemiological information.  Fact Sheet for Patients:   StrictlyIdeas.no  Fact Sheet for Healthcare Providers: BankingDealers.co.za  This test is not yet approved or  cleared by the [place] FDA and has been authorized for detection and/or diagnosis of SARS-CoV-2 by FDA under an Emergency Use Authorization (EUA).  This EUA will remain in effect (meaning this test can be used) for the duration of the COVID-19 declaration under Section 564(b)(1) of the Act, 21 U.S.C. section 360bbb-3(b)(1), unless the authorization is terminated or revoked sooner.  Performed at [facility], [place] [address], [place], [place] [address]      Radiological Exams on Admission: DG Chest Port 1 View  Result Date: 04/30/2020 CLINICAL DATA:  Shortness of breath and cough EXAM: PORTABLE CHEST 1 VIEW COMPARISON:  None. FINDINGS: Right lung is grossly clear. Patchy consolidations and ground-glass densities in the left mid to lower lung. Normal heart size. No pneumothorax. IMPRESSION: Patchy consolidations and ground-glass densities in the left mid to lower lung suspicious for pneumonia. Electronically Signed   By: Madie Reno.D.  On: 04/30/2020 20:52    EKG: Independently reviewed.  Sinus tachycardia.  Assessment/Plan Principal Problem:   Sepsis ([place]) Active Problems:   Acute respiratory failure with hypoxia (HCC)   Essential hypertension    1. Sepsis likely from pneumonia for which patient is on empiric antibiotics for community-acquired pneumonia.  Since there was some concern for COVID-19 infection risk for stone was negative we will repeat another 1 after few hours.  Follow cultures check urine for Legionella strep antigen.  Continue with aggressive hydration. 2. Nausea vomiting cause not clear.  Abdomen appears benign LFTs are normal will check lipase.  Could be from pneumonia.  If there is any further nausea vomiting will scan  abdomen. 3. Acute respiratory failure with hypoxia secondary to pneumonia. 4. History of hypertension presently holding antihypertensives due to septic picture.  As needed IV hydralazine for systolic blood pressure more than 160.  Patient usually takes lisinopril and hydrochlorothiazide which is on hold. 5. History of depression on Seroquel and venlafaxine. 6. Acute renal failure with no old labs to compare.  Check urine studies.  Continue hydration hold lisinopril hydrochlorothiazide. 7. Anemia  -follow CBC check anemia panel with next blood draw.  Patient will need further studies on this as outpatient if there is no significant fall in hemoglobin during inpatient.  Since patient has septic picture will need inpatient status.   DVT prophylaxis: Lovenox. Code Status: Full code. Family Communication: Discussed with patient. Disposition Plan: Home. Consults called: None. Admission status: Inpatient.   Rise Patience MD [facility] Pager [phone].  If 7PM-7AM, please contact night-coverage [email or web address] Password [email or web address]  05/01/2020, 1:12 AM

## 2020-05-01 NOTE — Progress Notes (Addendum)
PROGRESS NOTE    Kyle [name]  XNT:[id number] DOB: 1975-11-30 DOA: 04/30/2020 PCP: Libby Maw, MD    No chief complaint on file.   Brief Narrative:  PCP: Libby Maw, MD  Patient coming from: Home.  Chief Complaint: Shortness of breath nausea vomiting.  HPI: Kyle [name] is a 44 y.o. male with history of hypertension and depression was experiencing intractable nausea vomiting for the last 24 hours and went to check on this to the urgent care over there patient was hypoxic.  Patient also subsequently started having shortness of breath with some cough.  Denies chest pain.  Denies abdominal pain or diarrhea.  Had no fever or chills.  No recent change in medications or recent travel.  ED Course: In the ER patient was hypotensive with blood pressure in the 80s.  Was requiring 2 L oxygen to maintain sats.  Patient was given fluid bolus for sepsis protocol and blood cultures obtained and started on antibiotics for possible pneumonia since chest x-ray was showing infiltrates.  Patient's labs show CRP of 18.7 procalcitonin 1.05 creatinine of 2.7 no old 1 to compare.  Hemoglobin 11.8.  WBC count of 13.8.  LFTs are normal.  Abdomen appears benign.  Patient admitted for developing sepsis likely from pneumonia.  Subjective:  He is anxious , denies pain, he is on room air at rest   Assessment & Plan:   Principal Problem:   Sepsis ([place]) Active Problems:   Acute respiratory failure with hypoxia ([place])   Essential hypertension   Lobar pneumonia with severe sepsis/acute hypoxic respiratory failure present on admission -He was tachypneic respiratory rate 30, tachycardia heart rate 107, hypotensive systolic blood pressure 96/75, leukocytosis WBC 13.8, with greater than 20%  Bandemia, acute hypoxic respiratory failure (72% on room air per EMS report) on presentation -cxr: "Patchy consolidations and ground-glass densities in the left mid to lower lung suspicious for  pneumonia" -Respiratory viral panel negative, Covid screening negative, blood culture no growth, urine Legionella  antigen is pending, strep pneumo antigen is negative -He reports nausea and vomiting at home, suspect aspiration pneumonia versus community acquired pneumonia -Continue Rocephin and Zithromax, wean oxygen ,will check ambulating pulse ox tomorrow  Nausea vomiting patient report he has hiatal hernia, some time has nausea vomiting, he is in the process of getting fundoplication -No nausea vomiting in the hospital  Elevated cr, unknown baseline creatinine, but suspect component of acute AKI due to sepsis, continue hydration, repeat BMP in the morning, renal dosing meds Hold lisinopril and ACE HCTZ  Normocytic anemia Hemoglobin above 10, no sign of bleeding  HTN: He agreed to start Coreg  Elevated blood glucose We will check A1c, no reported history of diabetes  History of anxiety and depression continue Seroquel Effexor and scheduled Klonopin   Body mass index is 30.51 kg/m.    DVT prophylaxis: enoxaparin (LOVENOX) injection 40 mg Start: 05/01/20 1000   Code Status: Full Family Communication: Patient Disposition:   Status is: Inpatient   Dispo: The patient is from: home              Anticipated d/c is to: home              Anticipated d/c date is: 24-48hrs              transfer out of stepdown to med surg, Patient currently needing ivf for aki, needing abx for pneumonia, wean oxygen  Consultants:   none  Procedures:   none  Antimicrobials:  Rocephin/zitrho     Objective: Vitals:   05/01/20 0600 05/01/20 0800 05/01/20 0900 05/01/20 1000  BP: 127/72 (!) 153/91 (!) 155/113 (!) 150/88  Pulse: 89 88 (!) 104 100  Resp: 20 19 19  (!) 23  Temp: 97.8 F (36.6 C)     TempSrc: Oral     SpO2: 96% 97% 100% 100%  Weight:      Height:        Intake/Output Summary (Last 24 hours) at 05/01/2020 1032 Last data filed at 05/01/2020 0700 Gross per 24 hour   Intake 7025.3 ml  Output 500 ml  Net 6525.3 ml   Filed Weights   04/30/20 2025 05/01/20 0240  Weight: 108.9 kg 107.8 kg    Examination:  General exam: Anxious Respiratory system: Clear to auscultation. Respiratory effort normal. Cardiovascular system: S1 & S2 heard, RRR. No JVD, no murmur, No pedal edema. Gastrointestinal system: Abdomen is nondistended, soft and nontender. No organomegaly or masses felt. Normal bowel sounds heard. Central nervous system: Alert and oriented. No focal neurological deficits. Extremities: Symmetric 5 x 5 power. Skin: No rashes, lesions or ulcers Psychiatry: Anxious    Data Reviewed: I have personally reviewed following labs and imaging studies  CBC: Recent Labs  Lab 04/30/20 2055 05/01/20 0234  WBC 13.8* 13.9*  NEUTROABS 12.3* 13.1*  HGB 11.8* 10.1*  HCT 35.3* 30.9*  MCV 83.5 85.1  PLT 235 401    Basic Metabolic Panel: Recent Labs  Lab 04/30/20 2055 05/01/20 0234  NA 143 136  K 3.9 4.3  CL 103 105  CO2 27 20*  GLUCOSE 105* 185*  BUN 47* 39*  CREATININE 2.71* 2.30*  CALCIUM 9.8 8.1*    GFR: Estimated Creatinine Clearance: 53.6 mL/min (A) (by C-G formula based on SCr of 2.3 mg/dL (H)).  Liver Function Tests: Recent Labs  Lab 04/30/20 2055 05/01/20 0234  AST 14* 18  ALT 16 20  ALKPHOS 38 30*  BILITOT 0.8 0.3  PROT 7.4 6.4*  ALBUMIN 4.3 3.3*    CBG: No results for input(s): GLUCAP in the last 168 hours.   Recent Results (from the past 240 hour(s))  SARS Coronavirus 2 by RT PCR (hospital order, performed in [facility] hospital lab) Nasopharyngeal Nasopharyngeal Swab     Status: None   Collection Time: 04/30/20  8:36 PM   Specimen: Nasopharyngeal Swab  Result Value Ref Range Status   SARS Coronavirus 2 NEGATIVE NEGATIVE Final    Comment: (NOTE) SARS-CoV-2 target nucleic acids are NOT DETECTED.  The SARS-CoV-2 RNA is generally detectable in upper and lower respiratory specimens during the acute phase of  infection. The lowest concentration of SARS-CoV-2 viral copies this assay can detect is 250 copies / mL. A negative result does not preclude SARS-CoV-2 infection and should not be used as the sole basis for treatment or other patient management decisions.  A negative result may occur with improper specimen collection / handling, submission of specimen other than nasopharyngeal swab, presence of viral mutation(s) within the areas targeted by this assay, and inadequate number of viral copies (<250 copies / mL). A negative result must be combined with clinical observations, patient history, and epidemiological information.  Fact Sheet for Patients:   StrictlyIdeas.no  Fact Sheet for Healthcare Providers: BankingDealers.co.za  This test is not yet approved or  cleared by the [place] FDA and has been authorized for detection and/or diagnosis of SARS-CoV-2 by FDA under an Emergency Use Authorization (EUA).  This EUA will remain in effect (meaning  this test can be used) for the duration of the COVID-19 declaration under Section 564(b)(1) of the Act, 21 U.S.C. section 360bbb-3(b)(1), unless the authorization is terminated or revoked sooner.  Performed at [facility], [place] [address] [place], [place] [address]   Blood Culture (routine x 2)     Status: None (Preliminary result)   Collection Time: 04/30/20  8:55 PM   Specimen: BLOOD  Result Value Ref Range Status   Specimen Description   Final    BLOOD LEFT ANTECUBITAL Performed at [facility] [address], [place], [place] [address]    Special Requests   Final    BOTTLES DRAWN AEROBIC AND ANAEROBIC Blood Culture adequate volume Performed at [facility] [address], [place], [place] [address]    Culture   Final    NO GROWTH < 12 HOURS Performed at [facility] [address] [place], [place] [address]     Report Status PENDING  Incomplete  Blood Culture (routine x 2)     Status: None (Preliminary result)   Collection Time: 04/30/20 10:57 PM   Specimen: BLOOD LEFT HAND  Result Value Ref Range Status   Specimen Description   Final    BLOOD LEFT HAND Performed at [facility] [address], [place], [place] [address]    Special Requests   Final    BOTTLES DRAWN AEROBIC AND ANAEROBIC Blood Culture adequate volume Performed at [facility] [address], [place], [place] [address]    Culture   Final    NO GROWTH < 12 HOURS Performed at [facility] [address], [place], [place] [address]    Report Status PENDING  Incomplete  MRSA PCR Screening     Status: None   Collection Time: 05/01/20  3:08 AM   Specimen: Nasal Mucosa; Nasopharyngeal  Result Value Ref Range Status   MRSA by PCR NEGATIVE NEGATIVE Final    Comment:        The GeneXpert MRSA Assay (FDA approved for NASAL specimens only), is one component of a comprehensive MRSA colonization surveillance program. It is not intended to diagnose MRSA infection nor to guide or monitor treatment for MRSA infections. Performed at [facility], [place] [address] [place], [place] [address]   SARS Coronavirus 2 by RT PCR (hospital order, performed in [facility] hospital lab) Nasopharyngeal Urine, Random     Status: None   Collection Time: 05/01/20  8:11 AM   Specimen: Urine, Random; Nasopharyngeal  Result Value Ref Range Status   SARS Coronavirus 2 NEGATIVE NEGATIVE Final    Comment: (NOTE) SARS-CoV-2 target nucleic acids are NOT DETECTED.  The SARS-CoV-2 RNA is generally detectable in upper and lower respiratory specimens during the acute phase of infection. The lowest concentration of SARS-CoV-2 viral copies this assay can detect is 250 copies / mL. A negative result does not preclude SARS-CoV-2 infection and should not be used as the sole basis for treatment or  other patient management decisions.  A negative result may occur with improper specimen collection / handling, submission of specimen other than nasopharyngeal swab, presence of viral mutation(s) within the areas targeted by this assay, and inadequate number of viral copies (<250 copies / mL). A negative result must be combined with clinical observations, patient history, and epidemiological information.  Fact Sheet for Patients:   StrictlyIdeas.no  Fact Sheet for Healthcare Providers: BankingDealers.co.za  This test is not yet  approved or  cleared by the [place] and has been authorized for detection and/or diagnosis of SARS-CoV-2 by FDA under an Emergency Use Authorization (EUA).  This EUA will remain in effect (meaning this test can be used) for the duration of the COVID-19 declaration under Section 564(b)(1) of the Act, 21 U.S.C. section 360bbb-3(b)(1), unless the authorization is terminated or revoked sooner.  Performed at [facility], [place] [address], [place], [place] [address]          Radiology Studies: [facility] Chest Port 1 View  Result Date: 04/30/2020 CLINICAL DATA:  Shortness of breath and cough EXAM: PORTABLE CHEST 1 VIEW COMPARISON:  None. FINDINGS: Right lung is grossly clear. Patchy consolidations and ground-glass densities in the left mid to lower lung. Normal heart size. No pneumothorax. IMPRESSION: Patchy consolidations and ground-glass densities in the left mid to lower lung suspicious for pneumonia. Electronically Signed   By: Donavan Foil M.D.   On: 04/30/2020 20:52        Scheduled Meds: . Chlorhexidine Gluconate Cloth  6 each Topical Daily  . [START ON 05/02/2020] clonazePAM  1 mg Oral BID  . enoxaparin (LOVENOX) injection  40 mg Subcutaneous Q24H  . fenofibrate  160 mg Oral Daily  . guaiFENesin  600 mg Oral BID  . pantoprazole  40 mg Oral Daily  . [START ON 05/02/2020] QUEtiapine   150 mg Oral QHS  . venlafaxine XR  225 mg Oral Daily   Continuous Infusions: . azithromycin Stopped (05/01/20 0113)  . cefTRIAXone (ROCEPHIN)  IV Stopped (05/01/20 0113)  . lactated ringers    . lactated ringers 125 mL/hr at 05/01/20 0234     LOS: 1 day   Time spent: 31mins Greater than 50% of this time was spent in counseling, explanation of diagnosis, planning of further management, and coordination of care.  I have personally reviewed and interpreted on  05/01/2020 daily labs, tele strips, imagings as discussed above under date review session and assessment and plans.  I reviewed all nursing notes, pharmacy notes, consultant notes,  vitals, pertinent old records  I have discussed plan of care as described above with RN , patient  on 05/01/2020  Voice Recognition /Dragon dictation system was used to create this note, attempts have been made to correct errors. Please contact the author with questions and/or clarifications.   Florencia Reasons, MD PhD FACP [facility]  Available via Epic secure chat 7am-7pm for nonurgent issues Please page for urgent issues To page the attending provider between 7A-7P or the covering provider during after hours 7P-7A, please log into the web site [email or web address] and access using universal [place] password for that web site. If you do not have the password, please call the hospital operator.    05/01/2020, 10:32 AM

## 2020-05-01 NOTE — ED Notes (Signed)
Attempted to call report, no answer

## 2020-05-01 NOTE — Progress Notes (Signed)
Patient stating he want to leave against medical advice notified Dr. Erlinda Hong after speaking to doctor patient agreed to stay one more night. Will transfer to med surg .

## 2020-05-02 DIAGNOSIS — E119 Type 2 diabetes mellitus without complications: Secondary | ICD-10-CM

## 2020-05-02 LAB — TSH: TSH: 0.887 u[IU]/mL (ref 0.350–4.500)

## 2020-05-02 LAB — D-DIMER, QUANTITATIVE: D-Dimer, Quant: 0.61 ug/mL-FEU — ABNORMAL HIGH (ref 0.00–0.50)

## 2020-05-02 LAB — BASIC METABOLIC PANEL
Anion gap: 9 (ref 5–15)
BUN: 27 mg/dL — ABNORMAL HIGH (ref 6–20)
CO2: 23 mmol/L (ref 22–32)
Calcium: 8.8 mg/dL — ABNORMAL LOW (ref 8.9–10.3)
Chloride: 109 mmol/L (ref 98–111)
Creatinine, Ser: 1.24 mg/dL (ref 0.61–1.24)
GFR calc Af Amer: >60 mL/min (ref >60)
GFR calc non Af Amer: >60 mL/min (ref >60)
Glucose, Bld: 140 mg/dL — ABNORMAL HIGH (ref 70–99)
Potassium: 4.2 mmol/L (ref 3.5–5.1)
Sodium: 141 mmol/L (ref 135–145)

## 2020-05-02 LAB — LACTIC ACID, PLASMA: Lactic Acid, Venous: 1.1 mmol/L (ref 0.5–1.9)

## 2020-05-02 LAB — HEMOGLOBIN A1C
Hgb A1c MFr Bld: 6.6 % — ABNORMAL HIGH (ref 4.8–5.6)
Mean Plasma Glucose: 142.72 mg/dL

## 2020-05-02 LAB — PROCALCITONIN: Procalcitonin: 0.36 ng/mL

## 2020-05-02 LAB — BRAIN NATRIURETIC PEPTIDE: B Natriuretic Peptide: 386.1 pg/mL — ABNORMAL HIGH (ref 0.0–100.0)

## 2020-05-02 LAB — C-REACTIVE PROTEIN: CRP: 11.6 mg/dL — ABNORMAL HIGH (ref <1.0)

## 2020-05-02 MED ORDER — AMOXICILLIN-POT CLAVULANATE 875-125 MG PO TABS: 1.0000 | ORAL_TABLET | Freq: Two times a day (BID) | ORAL | 0 refills | Status: AC

## 2020-05-02 MED ORDER — HYDROCHLOROTHIAZIDE 12.5 MG PO CAPS: 12.5000 mg | ORAL_CAPSULE | Freq: Every day | ORAL | 0 refills | Status: DC

## 2020-05-02 MED ORDER — LOSARTAN POTASSIUM 25 MG PO TABS: 25.0000 mg | ORAL_TABLET | Freq: Every day | ORAL | 0 refills | Status: DC

## 2020-05-02 MED ORDER — AMOXICILLIN-POT CLAVULANATE 875-125 MG PO TABS
1.0000 | ORAL_TABLET | Freq: Two times a day (BID) | ORAL | Status: DC
  Administered 2020-05-02: 1 via ORAL
  Filled 2020-05-02: qty 1

## 2020-05-02 MED ORDER — LOSARTAN POTASSIUM 25 MG PO TABS
25.0000 mg | ORAL_TABLET | Freq: Every day | ORAL | Status: DC
  Administered 2020-05-02: 25 mg via ORAL
  Filled 2020-05-02: qty 1

## 2020-05-02 NOTE — Discharge Summary (Signed)
Discharge Summary  Irven Ingalsbe WFU:[id number] DOB: 03-Jun-1976  PCP: Libby Maw, MD  Admit date: 04/30/2020 Discharge date: 05/02/2020  Time spent:  62mins  Recommendations for Outpatient Follow-up:  1. F/u with PCP within a week  for hospital discharge follow up, repeat cbc/bmp at follow up. pcp to monitor blood glucose and renal function. pcp to follow up on pending lab test urine legionella antigen test  Discharge Diagnoses:  Active Hospital Problems   Diagnosis Date Noted  . Sepsis ([place]) 05/01/2020  . Essential hypertension 05/01/2020  . Acute respiratory failure with hypoxia ([place]) 04/30/2020    Resolved Hospital Problems  No resolved problems to display.    Discharge Condition: stable  Diet recommendation: heart healthy/carb modified  Filed Weights   04/30/20 2025 05/01/20 0240  Weight: 108.9 kg 107.8 kg    History of present illness: ( per admitting Md Dr Hal Hope) PCP: Libby Maw, MD  Patient coming from: Home.  Chief Complaint: Shortness of breath nausea vomiting.  HPI: Kyle [name] is a 44 y.o. male with history of hypertension and depression was experiencing intractable nausea vomiting for the last 24 hours and went to check on this to the urgent care over there patient was hypoxic.  Patient also subsequently started having shortness of breath with some cough.  Denies chest pain.  Denies abdominal pain or diarrhea.  Had no fever or chills.  No recent change in medications or recent travel.  ED Course: In the ER patient was hypotensive with blood pressure in the 80s.  Was requiring 2 L oxygen to maintain sats.  Patient was given fluid bolus for sepsis protocol and blood cultures obtained and started on antibiotics for possible pneumonia since chest x-ray was showing infiltrates.  Patient's labs show CRP of 18.7 procalcitonin 1.05 creatinine of 2.7 no old 1 to compare.  Hemoglobin 11.8.  WBC count of 13.8.  LFTs are normal.  Abdomen appears  benign.  Patient admitted for developing sepsis likely from pneumonia.  Hospital Course:  Principal Problem:   Sepsis [facility]) Active Problems:   Acute respiratory failure with hypoxia (HCC)   Essential hypertension   Lobar pneumonia with severe sepsis/acute hypoxic respiratory failure present on admission -He was tachypneic respiratory rate 30, tachycardia heart rate 107, hypotensive systolic blood pressure 20/25, leukocytosis WBC 13.8, with greater than 20%  Bandemia, acute hypoxic respiratory failure (72% on room air per EMS report) and AKI on presentation -cxr: "Patchy consolidations and ground-glass densities in the left mid to lower lung suspicious for pneumonia" -Respiratory viral panel negative, Covid screening negative, blood culture no growth, urine Legionella  antigen is pending, (which likely will be negative), strep pneumo antigen is negative -He reports nausea and vomiting at home, suspect aspiration pneumonia versus community acquired pneumonia -he improved on Rocephin and Zithromax, he is weaned off oxygen , he desired to go home, he is discharged on augmentin to finish abx treatment. -f/u with pcp for hospital discharge follow up and ensure continued improvement. Consider repeat cxr in 3-4 weeks to ensure resolution cxr findings.  Nausea vomiting  patient report he has hiatal hernia, some time has nausea vomiting, he is in the process of getting fundoplication -No nausea vomiting in the hospital  AKI on CKDII  unknown baseline creatinine,  BUN 47/creatinine 2.71 on presentation, BUN 27/creatinine 1.24 at discharge  AKI due to sepsis,  Improved with treating pneumonia and getting hydration Home meds  lisinopril / HCTZ held in the hospital He report lisinopril caused cough, lisinopril  changed to Cozaar at discharge PCP to monitor renal function  Normocytic anemia, likely anemia of chronic disease Hemoglobin above 10, no sign of bleeding  HTN:  Coreg 3.125mg  bid used  in the hospital for blood pressure control with resolution of AKI, and the setting of prediabetes/borderline diabetes, he is discharged on Cozaar for renal protection   Borderline diabetes with hyperglycemia A1c 6.6 ,  Previously not on meds,  Discussed diet control and weight loss F/u with pcp for blood glucose management  History of anxiety and depression continue Seroquel, Effexor and scheduled Klonopin   Body mass index is 30.51 kg/m.    DVT prophylaxis: enoxaparin (LOVENOX) injection 40 mg Start: 05/01/20 1000  Code Status: Full Family Communication: Patient Disposition:   Status is: Inpatient   Dispo: The patient is from: home  Anticipated d/c is to: home   Consultants:   none  Procedures:   none  Antimicrobials:   Rocephin/zitrho   Discharge Exam: BP 104/63 (BP Location: Left Arm)   Pulse 64   Temp 97.7 F (36.5 C) (Axillary)   Resp 18   Ht 6\' 2"  (1.88 m)   Wt 107.8 kg   SpO2 98%   BMI 30.51 kg/m   General: NAD Cardiovascular: RRR Respiratory: CTABL  Discharge Instructions You were cared for by a hospitalist during your hospital stay. If you have any questions about your discharge medications or the care you received while you were in the hospital after you are discharged, you can call the unit and asked to speak with the hospitalist on call if the hospitalist that took care of you is not available. Once you are discharged, your primary care physician will handle any further medical issues. Please note that NO REFILLS for any discharge medications will be authorized once you are discharged, as it is imperative that you return to your primary care physician (or establish a relationship with a primary care physician if you do not have one) for your aftercare needs so that they can reassess your need for medications and monitor your lab values.  Discharge Instructions    Diet - low sodium heart healthy   Complete  by: As directed    Carb modified   Increase activity slowly   Complete by: As directed      Allergies as of 05/02/2020      Reactions   Benzonatate Other (See Comments)      Medication List    STOP taking these medications   lisinopril-hydrochlorothiazide 10-12.5 MG tablet Commonly known as: ZESTORETIC     TAKE these medications   amoxicillin-clavulanate 875-125 MG tablet Commonly known as: AUGMENTIN Take 1 tablet by mouth every 12 (twelve) hours for 5 days.   clonazePAM 1 MG tablet Commonly known as: KLONOPIN Take 1 mg by mouth 2 (two) times daily.   fenofibrate 160 MG tablet Take 160 mg by mouth daily.   hydrochlorothiazide 12.5 MG capsule Commonly known as: Microzide Take 1 capsule (12.5 mg total) by mouth daily.   losartan 25 MG tablet Commonly known as: COZAAR Take 1 tablet (25 mg total) by mouth daily.   pantoprazole 40 MG tablet Commonly known as: PROTONIX Take 40 mg by mouth daily.   QUEtiapine 50 MG Tb24 24 hr tablet Commonly known as: SEROQUEL XR Take 3 tablets by mouth at bedtime.   venlafaxine XR 75 MG 24 hr capsule Commonly known as: EFFEXOR-XR Take 3 capsules by mouth daily.      Allergies  Allergen Reactions  .  Benzonatate Other (See Comments)    Follow-up Information    Libby Maw, MD Follow up in 1 week(s).   Specialty: Family Medicine Why: hospital discharge follow up, repeat cbc/bmp at follow up,  Contact information: [place] [address] [phone]                The results of significant diagnostics from this hospitalization (including imaging, microbiology, ancillary and laboratory) are listed below for reference.    Significant Diagnostic Studies: US RENAL  Result Date: 05/01/2020 CLINICAL DATA:  Elevated creatinine.  Hypertension. EXAM: RENAL / URINARY TRACT ULTRASOUND COMPLETE COMPARISON:  None. FINDINGS: Right Kidney: Renal measurements: 13.6 x 6.4 x 6.6 centimeter = volume:  298.3 mL . Echogenicity within normal limits. No mass or hydronephrosis visualized. Left Kidney: Renal measurements: 12.6 x 6.1 x 6.6 centimeters = volume: 264.4 mL. Echogenicity within normal limits. No mass or hydronephrosis visualized. Bladder: Appears normal for degree of bladder distention. Other: None. IMPRESSION: Normal renal ultrasound. Electronically Signed   By: Nolon Nations M.D.   On: 05/01/2020 11:47   DG Chest Port 1 View  Result Date: 04/30/2020 CLINICAL DATA:  Shortness of breath and cough EXAM: PORTABLE CHEST 1 VIEW COMPARISON:  None. FINDINGS: Right lung is grossly clear. Patchy consolidations and ground-glass densities in the left mid to lower lung. Normal heart size. No pneumothorax. IMPRESSION: Patchy consolidations and ground-glass densities in the left mid to lower lung suspicious for pneumonia. Electronically Signed   By: Donavan Foil M.D.   On: 04/30/2020 20:52    Microbiology: Recent Results (from the past 240 hour(s))  SARS Coronavirus 2 by RT PCR (hospital order, performed in [facility] hospital lab) Nasopharyngeal Nasopharyngeal Swab     Status: None   Collection Time: 04/30/20  8:36 PM   Specimen: Nasopharyngeal Swab  Result Value Ref Range Status   SARS Coronavirus 2 NEGATIVE NEGATIVE Final    Comment: (NOTE) SARS-CoV-2 target nucleic acids are NOT DETECTED.  The SARS-CoV-2 RNA is generally detectable in upper and lower respiratory specimens during the acute phase of infection. The lowest concentration of SARS-CoV-2 viral copies this assay can detect is 250 copies / mL. A negative result does not preclude SARS-CoV-2 infection and should not be used as the sole basis for treatment or other patient management decisions.  A negative result may occur with improper specimen collection / handling, submission of specimen other than nasopharyngeal swab, presence of viral mutation(s) within the areas targeted by this assay, and inadequate number of viral  copies (<250 copies / mL). A negative result must be combined with clinical observations, patient history, and epidemiological information.  Fact Sheet for Patients:   StrictlyIdeas.no  Fact Sheet for Healthcare Providers: BankingDealers.co.za  This test is not yet approved or  cleared by the [place] FDA and has been authorized for detection and/or diagnosis of SARS-CoV-2 by FDA under an Emergency Use Authorization (EUA).  This EUA will remain in effect (meaning this test can be used) for the duration of the COVID-19 declaration under Section 564(b)(1) of the Act, 21 U.S.C. section 360bbb-3(b)(1), unless the authorization is terminated or revoked sooner.  Performed at [facility], [place] [address] [place],  [address]   Blood Culture (routine x 2)     Status: None (Preliminary result)   Collection Time: 04/30/20  8:55 PM   Specimen: BLOOD  Result Value Ref Range Status   Specimen Description   Final    BLOOD LEFT  ANTECUBITAL Performed at [facility], [place] [address] [place], [place] [address]    Special Requests   Final    BOTTLES DRAWN AEROBIC AND ANAEROBIC Blood Culture adequate volume Performed at [place] [address] [place], [place] [address]    Culture   Final    NO GROWTH < 12 HOURS Performed at [place] [address], [place], [place] [address]    Report Status PENDING  Incomplete  Blood Culture (routine x 2)     Status: None (Preliminary result)   Collection Time: 04/30/20 10:57 PM   Specimen: BLOOD LEFT HAND  Result Value Ref Range Status   Specimen Description   Final    BLOOD LEFT HAND Performed at [place] [address] [place], [place] [address]    Special Requests   Final    BOTTLES DRAWN AEROBIC AND ANAEROBIC Blood Culture adequate volume Performed at [place]  [address] [place], [place] [address]    Culture   Final    NO GROWTH < 12 HOURS Performed at [place] [address], [place], [place] [address]    Report Status PENDING  Incomplete  Respiratory Panel by PCR     Status: None   Collection Time: 04/30/20 11:21 PM   Specimen: Nasopharyngeal Swab; Respiratory  Result Value Ref Range Status   Adenovirus NOT DETECTED NOT DETECTED Final   Coronavirus 229E NOT DETECTED NOT DETECTED Final    Comment: (NOTE) The Coronavirus on the Respiratory Panel, DOES NOT test for the novel  Coronavirus (2019 nCoV)    Coronavirus HKU1 NOT DETECTED NOT DETECTED Final   Coronavirus NL63 NOT DETECTED NOT DETECTED Final   Coronavirus OC43 NOT DETECTED NOT DETECTED Final   Metapneumovirus NOT DETECTED NOT DETECTED Final   Rhinovirus / Enterovirus NOT DETECTED NOT DETECTED Final   Influenza A NOT DETECTED NOT DETECTED Final   Influenza B NOT DETECTED NOT DETECTED Final   Parainfluenza Virus 1 NOT DETECTED NOT DETECTED Final   Parainfluenza Virus 2 NOT DETECTED NOT DETECTED Final   Parainfluenza Virus 3 NOT DETECTED NOT DETECTED Final   Parainfluenza Virus 4 NOT DETECTED NOT DETECTED Final   Respiratory Syncytial Virus NOT DETECTED NOT DETECTED Final   Bordetella pertussis NOT DETECTED NOT DETECTED Final   Chlamydophila pneumoniae NOT DETECTED NOT DETECTED Final   Mycoplasma pneumoniae NOT DETECTED NOT DETECTED Final    Comment: Performed at [facility], [place]. [address] [place], [place] [address]  MRSA PCR Screening     Status: None   Collection Time: 05/01/20  3:08 AM   Specimen: Nasal Mucosa; Nasopharyngeal  Result Value Ref Range Status   MRSA by PCR NEGATIVE NEGATIVE Final    Comment:        The GeneXpert MRSA Assay (FDA approved for NASAL specimens only), is one component of a comprehensive MRSA colonization surveillance program. It is not intended to diagnose MRSA infection nor to guide or monitor treatment for MRSA  infections. Performed at [facility], [place] [address], [place], [place] [address]   SARS Coronavirus 2 by RT PCR (hospital order, performed in [facility] hospital lab) Nasopharyngeal Urine, Random     Status: None   Collection Time: 05/01/20  8:11 AM   Specimen: Urine, Random; Nasopharyngeal  Result Value Ref Range Status   SARS Coronavirus 2 NEGATIVE NEGATIVE Final    Comment: (NOTE) SARS-CoV-2 target nucleic acids  are NOT DETECTED.  The SARS-CoV-2 RNA is generally detectable in upper and lower respiratory specimens during the acute phase of infection. The lowest concentration of SARS-CoV-2 viral copies this assay can detect is 250 copies / mL. A negative result does not preclude SARS-CoV-2 infection and should not be used as the sole basis for treatment or other patient management decisions.  A negative result may occur with improper specimen collection / handling, submission of specimen other than nasopharyngeal swab, presence of viral mutation(s) within the areas targeted by this assay, and inadequate number of viral copies (<250 copies / mL). A negative result must be combined with clinical observations, patient history, and epidemiological information.  Fact Sheet for Patients:   StrictlyIdeas.no  Fact Sheet for Healthcare Providers: BankingDealers.co.za  This test is not yet approved or  cleared by the [place] FDA and has been authorized for detection and/or diagnosis of SARS-CoV-2 by FDA under an Emergency Use Authorization (EUA).  This EUA will remain in effect (meaning this test can be used) for the duration of the COVID-19 declaration under Section 564(b)(1) of the Act, 21 U.S.C. section 360bbb-3(b)(1), unless the authorization is terminated or revoked sooner.  Performed at [facility], [place] [address], [place], [place] [address]      Labs: Basic Metabolic Panel: Recent  Labs  Lab 04/30/20 2055 05/01/20 0234 05/02/20 0531  NA 143 136 141  K 3.9 4.3 4.2  CL 103 105 109  CO2 27 20* 23  GLUCOSE 105* 185* 140*  BUN 47* 39* 27*  CREATININE 2.71* 2.30* 1.24  CALCIUM 9.8 8.1* 8.8*   Liver Function Tests: Recent Labs  Lab 04/30/20 2055 05/01/20 0234  AST 14* 18  ALT 16 20  ALKPHOS 38 30*  BILITOT 0.8 0.3  PROT 7.4 6.4*  ALBUMIN 4.3 3.3*   Recent Labs  Lab 05/01/20 0652  LIPASE 22   No results for input(s): AMMONIA in the last 168 hours. CBC: Recent Labs  Lab 04/30/20 2055 05/01/20 0234 05/01/20 1102 05/01/20 1913  WBC 13.8* 13.9* 13.4* 15.3*  NEUTROABS 12.3* 13.1* 12.6* 13.6*  HGB 11.8* 10.1* 10.3* 10.2*  HCT 35.3* 30.9* 31.1* 31.4*  MCV 83.5 85.1 84.3 84.6  PLT 235 212 211 230   Cardiac Enzymes: No results for input(s): CKTOTAL, CKMB, CKMBINDEX, TROPONINI in the last 168 hours. BNP: BNP (last 3 results) Recent Labs    05/02/20 0531  BNP 386.1*    ProBNP (last 3 results) No results for input(s): PROBNP in the last 8760 hours.  CBG: No results for input(s): GLUCAP in the last 168 hours.     Signed:  Florencia Reasons MD, PhD, FACP  [facility] 05/02/2020, 9:54 AM  \

## 2020-05-02 NOTE — Progress Notes (Signed)
Nurse reviewed discharge instructions with pt. Pt verbalized understanding of discharge instructions, follow up appointments and new medications.  O2 sats checked while ambulating.  Pt noted to be 93-99% on room air will ambulating.

## 2020-05-03 ENCOUNTER — Encounter: Payer: Self-pay | Admitting: Family Medicine

## 2020-05-03 NOTE — Telephone Encounter (Signed)
Needs to quarantine for 14 days from the start of symptoms.

## 2020-05-04 LAB — LEGIONELLA PNEUMOPHILA SEROGP 1 UR AG: L. pneumophila Serogp 1 Ur Ag: NEGATIVE

## 2020-05-05 LAB — CULTURE, BLOOD (ROUTINE X 2)
Culture: NO GROWTH
Culture: NO GROWTH
Special Requests: ADEQUATE
Special Requests: ADEQUATE

## 2020-05-10 ENCOUNTER — Encounter: Payer: Self-pay | Admitting: Family Medicine

## 2020-05-10 ENCOUNTER — Telehealth (INDEPENDENT_AMBULATORY_CARE_PROVIDER_SITE_OTHER): Payer: BC Managed Care – PPO | Admitting: Family Medicine

## 2020-05-10 VITALS — Ht 74.0 in

## 2020-05-10 DIAGNOSIS — I10 Essential (primary) hypertension: Secondary | ICD-10-CM

## 2020-05-10 DIAGNOSIS — E1165 Type 2 diabetes mellitus with hyperglycemia: Secondary | ICD-10-CM | POA: Diagnosis not present

## 2020-05-10 DIAGNOSIS — F418 Other specified anxiety disorders: Secondary | ICD-10-CM

## 2020-05-10 DIAGNOSIS — K219 Gastro-esophageal reflux disease without esophagitis: Secondary | ICD-10-CM

## 2020-05-10 DIAGNOSIS — E781 Pure hyperglyceridemia: Secondary | ICD-10-CM

## 2020-05-10 DIAGNOSIS — F1021 Alcohol dependence, in remission: Secondary | ICD-10-CM | POA: Insufficient documentation

## 2020-05-10 DIAGNOSIS — E785 Hyperlipidemia, unspecified: Secondary | ICD-10-CM | POA: Insufficient documentation

## 2020-05-10 MED ORDER — HYDROCHLOROTHIAZIDE 12.5 MG PO CAPS: 12.5000 mg | ORAL_CAPSULE | Freq: Every day | ORAL | 2 refills | Status: DC

## 2020-05-10 MED ORDER — METFORMIN HCL ER 500 MG PO TB24: 500.0000 mg | ORAL_TABLET | Freq: Every day | ORAL | 2 refills | Status: DC

## 2020-05-10 MED ORDER — PANTOPRAZOLE SODIUM 40 MG PO TBEC: 40.0000 mg | DELAYED_RELEASE_TABLET | Freq: Every day | ORAL | 2 refills | Status: DC

## 2020-05-10 MED ORDER — FENOFIBRATE 160 MG PO TABS: 160.0000 mg | ORAL_TABLET | Freq: Every day | ORAL | 2 refills | Status: DC

## 2020-05-10 MED ORDER — LOSARTAN POTASSIUM 25 MG PO TABS: 25.0000 mg | ORAL_TABLET | Freq: Every day | ORAL | 2 refills | Status: DC

## 2020-05-10 NOTE — Progress Notes (Addendum)
New Patient Office Visit  Subjective:  Patient ID: Kyle [name], male    DOB: Apr 21, 1976  Age: 44 y.o. MRN: [id number]  CC:  Chief Complaint  Patient presents with  . Establish Care    New patient, refill on medication video visit due to patient thinking he was postive for COVID but found out at visit that he was negative.     HPI Kyle [name] presents for follow-up of hypertension hypertriglyceridemia and depression with anxiety. Status post recent hospitalization for an acute respiratory illness that he tells me was not Covid. He had been in the ICU for 3 days. He does not remember much of his hospital stay. Chart review shows an elevated hemoglobin A1c that was measured at that time. He has a history of prediabetes in the past but is never been treated for diabetes. He is a recovering alcoholic who has been sober for 9 months. He had some time in sobriety prior to 9 months ago but had started drinking again when he was divorcing his then wife. Children were involved. He had been treated for depression with Effexor and Seroquel prior to this time. He is currently in a new relationship and doesn't believe that he might need these medications at this time. He is sober and attending [place] meetings currently.  Past Medical History:  Diagnosis Date  . Allergy   . Anxiety    patient states history   . Depression    history   . Hyperlipidemia   . Hypertension     Past Surgical History:  Procedure Laterality Date  . left leg surgery    . SINUSOTOMY    . TONSILLECTOMY AND ADENOIDECTOMY      Family History  Problem Relation Age of Onset  . Healthy Mother   . Cancer Father   . Hypertension Father   . Hyperlipidemia Father   . Birth defects Maternal Grandfather   . Birth defects Paternal Grandfather     Social History   Socioeconomic History  . Marital status: Soil scientist    Spouse name: Not on file  . Number of children: Not on file  . Years of education: Not on file  .  Highest education level: Not on file  Occupational History  . Not on file  Tobacco Use  . Smoking status: Former Research scientist (life sciences)  . Smokeless tobacco: Former Network engineer  . Vaping Use: Every day  Substance and Sexual Activity  . Alcohol use: Not Currently    Alcohol/week: 4.0 standard drinks    Types: 1 Glasses of wine, 1 Cans of beer, 1 Shots of liquor, 1 Standard drinks or equivalent per week    Comment: no alcohol in 8-22months  . Drug use: Yes    Types: Marijuana  . Sexual activity: Not on file  Other Topics Concern  . Not on file  Social History Narrative  . Not on file   Social Determinants of Health   Financial Resource Strain:   . Difficulty of Paying Living Expenses:   Food Insecurity:   . Worried About Charity fundraiser in the Last Year:   . Arboriculturist in the Last Year:   Transportation Needs:   . Film/video editor (Medical):   Marland Kitchen Lack of Transportation (Non-Medical):   Physical Activity:   . Days of Exercise per Week:   . Minutes of Exercise per Session:   Stress:   . Feeling of Stress :   Social Connections:   .  Frequency of Communication with Friends and Family:   . Frequency of Social Gatherings with Friends and Family:   . Attends Religious Services:   . Active Member of Clubs or Organizations:   . Attends Archivist Meetings:   Marland Kitchen Marital Status:   Intimate Partner Violence:   . Fear of Current or Ex-Partner:   . Emotionally Abused:   Marland Kitchen Physically Abused:   . Sexually Abused:     ROS Review of Systems  Constitutional: Negative.   HENT: Negative.   Eyes: Negative for photophobia and visual disturbance.  Respiratory: Negative.   Cardiovascular: Negative.   Gastrointestinal: Negative.   Endocrine: Negative for polyphagia and polyuria.  Genitourinary: Negative.   Musculoskeletal: Negative for gait problem and joint swelling.  Skin: Negative for pallor and rash.  Allergic/Immunologic: Negative for immunocompromised state.    Neurological: Negative for tremors and speech difficulty.   Depression screen [facility] 2/9 05/10/2020 05/10/2020  Decreased Interest 0 0  Down, Depressed, Hopeless 0 0  PHQ - 2 Score 0 0  Altered sleeping 3 -  Tired, decreased energy 0 -  Change in appetite 1 -  Feeling bad or failure about yourself  1 -  Trouble concentrating 0 -  Moving slowly or fidgety/restless 0 -  Suicidal thoughts 0 -  PHQ-9 Score 5 -  Difficult doing work/chores Somewhat difficult -     Objective:   Today's Vitals: Ht 6\' 2"  (1.88 m)   BMI 30.51 kg/m   Physical Exam  Assessment & Plan:   Problem List Items Addressed This Visit      Cardiovascular and Mediastinum   Essential hypertension - Primary   Relevant Medications   losartan (COZAAR) 25 MG tablet   fenofibrate 160 MG tablet   hydrochlorothiazide (MICROZIDE) 12.5 MG capsule   Other Relevant Orders   CBC   Comprehensive metabolic panel   Microalbumin / creatinine urine ratio   Urinalysis, Routine w reflex microscopic     Digestive   Gastroesophageal reflux disease   Relevant Medications   pantoprazole (PROTONIX) 40 MG tablet     Endocrine   Type 2 diabetes mellitus with hyperglycemia, without long-term current use of insulin (HCC)   Relevant Medications   losartan (COZAAR) 25 MG tablet   metFORMIN (GLUCOPHAGE XR) 500 MG 24 hr tablet   Other Relevant Orders   Hemoglobin A1c   Microalbumin / creatinine urine ratio   Urinalysis, Routine w reflex microscopic     Other   Hypertriglyceridemia   Relevant Medications   losartan (COZAAR) 25 MG tablet   fenofibrate 160 MG tablet   hydrochlorothiazide (MICROZIDE) 12.5 MG capsule   metFORMIN (GLUCOPHAGE XR) 500 MG 24 hr tablet   Other Relevant Orders   Comprehensive metabolic panel   LDL cholesterol, direct   Lipid panel   Recovering alcoholic (HCC)   Depression with anxiety      Outpatient Encounter Medications as of 05/10/2020  Medication Sig  . fenofibrate 160 MG tablet Take 1  tablet (160 mg total) by mouth daily.  . hydrochlorothiazide (MICROZIDE) 12.5 MG capsule Take 1 capsule (12.5 mg total) by mouth daily.  Marland Kitchen losartan (COZAAR) 25 MG tablet Take 1 tablet (25 mg total) by mouth daily.  . pantoprazole (PROTONIX) 40 MG tablet Take 1 tablet (40 mg total) by mouth daily.  . QUEtiapine (SEROQUEL XR) 50 MG TB24 24 hr tablet Take 3 tablets by mouth at bedtime.  Marland Kitchen venlafaxine XR (EFFEXOR-XR) 75 MG 24 hr capsule Take 3 capsules  by mouth daily.  . [DISCONTINUED] fenofibrate 160 MG tablet Take 160 mg by mouth daily.  . [DISCONTINUED] hydrochlorothiazide (MICROZIDE) 12.5 MG capsule Take 1 capsule (12.5 mg total) by mouth daily.  . [DISCONTINUED] losartan (COZAAR) 25 MG tablet Take 1 tablet (25 mg total) by mouth daily.  . [DISCONTINUED] pantoprazole (PROTONIX) 40 MG tablet Take 40 mg by mouth daily.  . metFORMIN (GLUCOPHAGE XR) 500 MG 24 hr tablet Take 1 tablet (500 mg total) by mouth daily with breakfast.  . [DISCONTINUED] clonazePAM (KLONOPIN) 1 MG tablet Take 1 mg by mouth 2 (two) times daily. (Patient not taking: Reported on 05/10/2020)   No facility-administered encounter medications on file as of 05/10/2020.    Follow-up: Return in about 2 months (around 07/10/2020).   He will return fasting for blood work ordered above. Follow-up with me in 2 to 3 months. We'll discuss a possible taper of his psychotropics.  Libby Maw, MD

## 2020-05-11 ENCOUNTER — Other Ambulatory Visit: Payer: Self-pay

## 2020-05-12 ENCOUNTER — Other Ambulatory Visit (INDEPENDENT_AMBULATORY_CARE_PROVIDER_SITE_OTHER): Payer: Self-pay

## 2020-05-12 DIAGNOSIS — E781 Pure hyperglyceridemia: Secondary | ICD-10-CM

## 2020-05-12 DIAGNOSIS — I10 Essential (primary) hypertension: Secondary | ICD-10-CM

## 2020-05-12 DIAGNOSIS — E1165 Type 2 diabetes mellitus with hyperglycemia: Secondary | ICD-10-CM

## 2020-05-12 LAB — CBC
HCT: 43 % (ref 39.0–52.0)
Hemoglobin: 14 g/dL (ref 13.0–17.0)
MCHC: 32.6 g/dL (ref 30.0–36.0)
MCV: 91.2 fl (ref 78.0–100.0)
Platelets: 255 10*3/uL (ref 150.0–400.0)
RBC: 4.71 Mil/uL (ref 4.22–5.81)
RDW: 15.6 % — ABNORMAL HIGH (ref 11.5–15.5)
WBC: 6.9 10*3/uL (ref 4.0–10.5)

## 2020-05-12 LAB — COMPREHENSIVE METABOLIC PANEL
ALT: 31 U/L (ref 0–53)
AST: 26 U/L (ref 0–37)
Albumin: 4.1 g/dL (ref 3.5–5.2)
Alkaline Phosphatase: 69 U/L (ref 39–117)
BUN: 17 mg/dL (ref 6–23)
CO2: 24 mEq/L (ref 19–32)
Calcium: 9.1 mg/dL (ref 8.4–10.5)
Chloride: 102 mEq/L (ref 96–112)
Creatinine, Ser: 0.81 mg/dL (ref 0.40–1.50)
GFR: 103.08 mL/min (ref >60.00)
Glucose, Bld: 114 mg/dL — ABNORMAL HIGH (ref 70–99)
Potassium: 4.3 mEq/L (ref 3.5–5.1)
Sodium: 138 mEq/L (ref 135–145)
Total Bilirubin: 0.6 mg/dL (ref 0.2–1.2)
Total Protein: 6.7 g/dL (ref 6.0–8.3)

## 2020-05-12 LAB — LIPID PANEL
Cholesterol: 150 mg/dL (ref 0–200)
HDL: 41.8 mg/dL (ref >39.00)
LDL Cholesterol: 82 mg/dL (ref 0–99)
NonHDL: 108.16
Total CHOL/HDL Ratio: 4
Triglycerides: 133 mg/dL (ref 0.0–149.0)
VLDL: 26.6 mg/dL (ref 0.0–40.0)

## 2020-05-12 LAB — HEMOGLOBIN A1C: Hgb A1c MFr Bld: 6.7 % — ABNORMAL HIGH (ref 4.6–6.5)

## 2020-05-12 LAB — LDL CHOLESTEROL, DIRECT: Direct LDL: 83 mg/dL

## 2020-06-27 ENCOUNTER — Encounter: Payer: Self-pay | Admitting: Family Medicine

## 2020-06-28 NOTE — Telephone Encounter (Signed)
He is due for follow up with me this month. Please tell that we can discuss this at that time.

## 2020-07-02 NOTE — Telephone Encounter (Signed)
Agreed, I think that he should. The last time I saw him, things were going well. This is a change. He needs to fu with another provider.

## 2020-07-02 NOTE — Telephone Encounter (Signed)
Pt called to schedule appt as he was sent a msg to come in this month with Dr. Ethelene Hal for f/u. Dr. Ethelene Hal doesn't have any open appointments until January. Pt will be out of medications in a couple days. How should I handle?  He is taking 2/day of the heartburn medication noted below. He is also going to run out of seroquel and said it isn't supposed to be stopped abruptly. Please advise.

## 2020-07-07 ENCOUNTER — Telehealth: Payer: Self-pay | Admitting: Family Medicine

## 2020-07-07 ENCOUNTER — Other Ambulatory Visit: Payer: Self-pay | Admitting: Family Medicine

## 2020-07-07 NOTE — Telephone Encounter (Signed)
Pharmacy would like approval to change Seroquel 150mg  instead of three 50mg .

## 2020-07-07 NOTE — Telephone Encounter (Signed)
Spoke with pharmacist who verbally understood Seroquel 150mg  would be fine to switch to

## 2020-07-27 ENCOUNTER — Other Ambulatory Visit: Payer: Self-pay

## 2020-07-28 ENCOUNTER — Ambulatory Visit: Payer: 59 | Admitting: Family Medicine

## 2020-07-28 ENCOUNTER — Encounter: Payer: Self-pay | Admitting: Family Medicine

## 2020-07-28 ENCOUNTER — Telehealth: Payer: Self-pay | Admitting: Family Medicine

## 2020-07-28 VITALS — BP 136/84 | HR 78 | Temp 97.6°F | Ht 74.0 in | Wt 231.4 lb

## 2020-07-28 DIAGNOSIS — E781 Pure hyperglyceridemia: Secondary | ICD-10-CM | POA: Diagnosis not present

## 2020-07-28 DIAGNOSIS — F418 Other specified anxiety disorders: Secondary | ICD-10-CM

## 2020-07-28 DIAGNOSIS — F1520 Other stimulant dependence, uncomplicated: Secondary | ICD-10-CM

## 2020-07-28 DIAGNOSIS — I10 Essential (primary) hypertension: Secondary | ICD-10-CM | POA: Diagnosis not present

## 2020-07-28 DIAGNOSIS — K219 Gastro-esophageal reflux disease without esophagitis: Secondary | ICD-10-CM

## 2020-07-28 DIAGNOSIS — E1165 Type 2 diabetes mellitus with hyperglycemia: Secondary | ICD-10-CM

## 2020-07-28 MED ORDER — LOSARTAN POTASSIUM 25 MG PO TABS: 25.0000 mg | ORAL_TABLET | Freq: Every day | ORAL | 3 refills | Status: DC

## 2020-07-28 MED ORDER — QUETIAPINE FUMARATE 100 MG PO TABS: 100.0000 mg | ORAL_TABLET | Freq: Every day | ORAL | 1 refills | Status: DC

## 2020-07-28 MED ORDER — METFORMIN HCL ER 500 MG PO TB24: 500.0000 mg | ORAL_TABLET | Freq: Every day | ORAL | 3 refills | Status: DC

## 2020-07-28 MED ORDER — PANTOPRAZOLE SODIUM 40 MG PO TBEC: 40.0000 mg | DELAYED_RELEASE_TABLET | Freq: Two times a day (BID) | ORAL | 1 refills | Status: DC

## 2020-07-28 MED ORDER — FENOFIBRATE 160 MG PO TABS: 160.0000 mg | ORAL_TABLET | Freq: Every day | ORAL | 3 refills | Status: DC

## 2020-07-28 MED ORDER — HYDROCHLOROTHIAZIDE 12.5 MG PO CAPS: 12.5000 mg | ORAL_CAPSULE | Freq: Every day | ORAL | 3 refills | Status: DC

## 2020-07-28 NOTE — Telephone Encounter (Signed)
Ok with me 

## 2020-07-28 NOTE — Progress Notes (Signed)
Kyle [name] is a 44 y.o. male  Chief Complaint  Patient presents with  . Follow-up    f/u HTN/DM & med refills.  wants to discuss tampering off the Seroquel and Effexor.  also having to take the pantoprazole twice daily    HPI: Kyle [name] is a 44 y.o. male patient of Dr. Ethelene Hal who presents to the office today for:  1. Med refills - metformin 500mg  daily, losartan 25mg  daily, fenofibrate 160mg  daily, HCTZ 12.5mg  daily.  Lab Results  Component Value Date   HGBA1C 6.7 (H) 05/12/2020   Lab Results  Component Value Date   BUN 17 05/12/2020   Lab Results  Component Value Date   CREATININE 0.81 05/12/2020   Lab Results  Component Value Date   NA 138 05/12/2020   K 4.3 05/12/2020   CL 102 05/12/2020   CO2 24 05/12/2020   Lab Results  Component Value Date   CHOL 150 05/12/2020   HDL 41.80 05/12/2020   LDLCALC 82 05/12/2020   LDLDIRECT 83.0 05/12/2020   TRIG 133.0 05/12/2020   CHOLHDL 4 05/12/2020    2. He has been on anxiety meds x 15 years but is not sure anything has been helpful. He is currently on seroquel 150mg  qHS and effexor XR 225mg  daily. He feels his stress is much less being back 34min from his children (9yo and 6yo) after previously living in [place] for 3 years. He has a significant other with whom he is happy. He is in a better job as well. He feels he has worked thru many issues.   3. Pt has been taking protonix 40mg  daily to BID (at least half of the week). He was on 20mg  daily previously and prior to that was on omeprazole prior to that. He vomits at least one night per week.  He has EGD in [place] in early in 2021. Was recommended to have Nissen but then moved back to [place].   4. Pt is restless in his sleep at night. He drinks caffeine (mainly coffee) throughout the day including a cup before bed. Pt also vapes and does so at work and also at night. He notes his resting HR, per his apple watch, is 90-100bpm.  Past Medical History:  Diagnosis Date  .  Allergy   . Anxiety    patient states history   . Depression    history   . Hyperlipidemia   . Hypertension     Past Surgical History:  Procedure Laterality Date  . left leg surgery    . SINUSOTOMY    . TONSILLECTOMY AND ADENOIDECTOMY      Social History   Socioeconomic History  . Marital status: Soil scientist    Spouse name: Not on file  . Number of children: Not on file  . Years of education: Not on file  . Highest education level: Not on file  Occupational History  . Not on file  Tobacco Use  . Smoking status: Former Research scientist (life sciences)  . Smokeless tobacco: Former Network engineer  . Vaping Use: Every day  Substance and Sexual Activity  . Alcohol use: Not Currently    Alcohol/week: 4.0 standard drinks    Types: 1 Glasses of wine, 1 Cans of beer, 1 Shots of liquor, 1 Standard drinks or equivalent per week    Comment: no alcohol in 8-17months  . Drug use: Yes    Types: Marijuana  . Sexual activity: Not on file  Other Topics Concern  .  Not on file  Social History Narrative  . Not on file   Social Determinants of Health   Financial Resource Strain:   . Difficulty of Paying Living Expenses: Not on file  Food Insecurity:   . Worried About Charity fundraiser in the Last Year: Not on file  . Ran Out of Food in the Last Year: Not on file  Transportation Needs:   . Lack of Transportation (Medical): Not on file  . Lack of Transportation (Non-Medical): Not on file  Physical Activity:   . Days of Exercise per Week: Not on file  . Minutes of Exercise per Session: Not on file  Stress:   . Feeling of Stress : Not on file  Social Connections:   . Frequency of Communication with Friends and Family: Not on file  . Frequency of Social Gatherings with Friends and Family: Not on file  . Attends Religious Services: Not on file  . Active Member of Clubs or Organizations: Not on file  . Attends Archivist Meetings: Not on file  . Marital Status: Not on file  Intimate  Partner Violence:   . Fear of Current or Ex-Partner: Not on file  . Emotionally Abused: Not on file  . Physically Abused: Not on file  . Sexually Abused: Not on file    Family History  Problem Relation Age of Onset  . Healthy Mother   . Cancer Father   . Hypertension Father   . Hyperlipidemia Father   . Birth defects Maternal Grandfather   . Birth defects Paternal Grandfather      Immunization History  Administered Date(s) Administered  . Moderna SARS-COVID-2 Vaccination 04/18/2020, 05/16/2020    Outpatient Encounter Medications as of 07/28/2020  Medication Sig  . fenofibrate 160 MG tablet Take 1 tablet (160 mg total) by mouth daily.  . hydrochlorothiazide (MICROZIDE) 12.5 MG capsule Take 1 capsule (12.5 mg total) by mouth daily.  Marland Kitchen losartan (COZAAR) 25 MG tablet Take 1 tablet (25 mg total) by mouth daily.  . metFORMIN (GLUCOPHAGE XR) 500 MG 24 hr tablet Take 1 tablet (500 mg total) by mouth daily with breakfast.  . pantoprazole (PROTONIX) 40 MG tablet Take 1 tablet (40 mg total) by mouth 2 (two) times daily.  Marland Kitchen venlafaxine XR (EFFEXOR-XR) 75 MG 24 hr capsule TAKE THREE CAPSULES BY MOUTH ONE TIME DAILY  . [DISCONTINUED] fenofibrate 160 MG tablet Take 1 tablet (160 mg total) by mouth daily.  . [DISCONTINUED] hydrochlorothiazide (MICROZIDE) 12.5 MG capsule Take 1 capsule (12.5 mg total) by mouth daily.  . [DISCONTINUED] losartan (COZAAR) 25 MG tablet Take 1 tablet (25 mg total) by mouth daily.  . [DISCONTINUED] metFORMIN (GLUCOPHAGE XR) 500 MG 24 hr tablet Take 1 tablet (500 mg total) by mouth daily with breakfast.  . [DISCONTINUED] pantoprazole (PROTONIX) 40 MG tablet Take 1 tablet (40 mg total) by mouth daily.  . [DISCONTINUED] QUEtiapine (SEROQUEL XR) 50 MG TB24 24 hr tablet TAKE THREE TABLETS BY MOUTH EVERY EVENING  . QUEtiapine (SEROQUEL) 100 MG tablet Take 1 tablet (100 mg total) by mouth at bedtime.   No facility-administered encounter medications on file as of 07/28/2020.      ROS: Pertinent positives and negatives noted in HPI. Remainder of ROS non-contributory    Allergies  Allergen Reactions  . Benzonatate Other (See Comments)    BP 136/84   Pulse 78   Temp 97.6 F (36.4 C) (Temporal)   Ht 6\' 2"  (1.88 m)   Wt 231 lb  6.4 oz (105 kg)   SpO2 98%   BMI 29.71 kg/m   Physical Exam Constitutional:      General: He is not in acute distress.    Appearance: Normal appearance. He is not ill-appearing.  Cardiovascular:     Rate and Rhythm: Normal rate and regular rhythm.  Pulmonary:     Effort: No respiratory distress.  Neurological:     General: No focal deficit present.     Mental Status: He is alert and oriented to person, place, and time.  Psychiatric:        Mood and Affect: Mood normal.        Behavior: Behavior normal.      A/P:  1. Essential hypertension - controlled, at goal Refill: - losartan (COZAAR) 25 MG tablet; Take 1 tablet (25 mg total) by mouth daily.  Dispense: 90 tablet; Refill: 3 - hydrochlorothiazide (MICROZIDE) 12.5 MG capsule; Take 1 capsule (12.5 mg total) by mouth daily.  Dispense: 90 capsule; Refill: 3 - cont with regular CV exercise, low sodium diet  2. Hypertriglyceridemia - last FLP in 04/2020 Refill: - fenofibrate 160 MG tablet; Take 1 tablet (160 mg total) by mouth daily.  Dispense: 90 tablet; Refill: 3  3. Depression with anxiety - pt feels he has worked thru and dealt with many of the issues that were causing his depression and anxiety. He is now sober, in job he likes, lives close to his 2 children, and is in a healthy relationship Decrease: - QUEtiapine (SEROQUEL) 100 MG tablet; Take 1 tablet (100 mg total) by mouth at bedtime.  Dispense: 30 tablet; Refill: 1 - decreased from 150mg  to 100mg  qHS x 2 wks and then pt will send MyChart message to let me know how he is doing. If ok, will decrease to 50mg  qHS at that time - decreased effexor from 225mg  to 150mg  qHS. Pt will send MyChart message to let me  know how he is doing. If ok, will decrease to 75mg  qHS at that time  4. Gastroesophageal reflux disease, unspecified whether esophagitis present - symptoms x years, uncontrolled with breakthru symptoms even on protonix 40mg  BID. EGD in [place] in early 2021 and Nissen was recommended.  - Ambulatory referral to Gastroenterology Refill: - pantoprazole (PROTONIX) 40 MG tablet; Take 1 tablet (40 mg total) by mouth 2 (two) times daily.  Dispense: 60 tablet; Refill: 1  5. Type 2 diabetes mellitus with hyperglycemia, without long-term current use of insulin (HCC) - last A1C = 6.7 in 04/2020 Refill: - metFORMIN (GLUCOPHAGE XR) 500 MG 24 hr tablet; Take 1 tablet (500 mg total) by mouth daily with breakfast.  Dispense: 90 tablet; Refill: 3 - cont losartan 25mg  daily - discussed importance of regular CV exercise, low carb and sugar diet - f/u in 3 mo, A1C due at that time  6. 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  Safety protocols were in place, including screening questions prior to the visit, additional usage of staff PPE, and extensive cleaning of exam room while observing appropriate contact time as indicated for disinfecting solutions.

## 2020-07-28 NOTE — Telephone Encounter (Signed)
Pt would like to transfer care from Dr Ethelene Hal to Dr Bryan Lemma, he said Dr C said it was ok but we still needed it noted from both providers. Is this ok?

## 2020-07-28 NOTE — Patient Instructions (Addendum)
Decrease seroquel to 100mg  at bedtime Decrease effexor to 150mg  at bedtime Do this for about 2 wks and send me a mychart message at that time   [email or web address]

## 2020-07-28 NOTE — Telephone Encounter (Signed)
Okay with me 

## 2020-07-29 NOTE — Telephone Encounter (Signed)
Pt aware and scheduled for January

## 2020-08-04 ENCOUNTER — Encounter: Payer: Self-pay | Admitting: Gastroenterology

## 2020-08-06 ENCOUNTER — Telehealth (INDEPENDENT_AMBULATORY_CARE_PROVIDER_SITE_OTHER): Payer: 59 | Admitting: Family

## 2020-08-06 ENCOUNTER — Encounter: Payer: Self-pay | Admitting: Family

## 2020-08-06 ENCOUNTER — Encounter: Payer: Self-pay | Admitting: Family Medicine

## 2020-08-06 VITALS — Temp 101.0°F | Ht 74.0 in | Wt 226.0 lb

## 2020-08-06 DIAGNOSIS — K219 Gastro-esophageal reflux disease without esophagitis: Secondary | ICD-10-CM | POA: Diagnosis not present

## 2020-08-06 DIAGNOSIS — T17908D Unspecified foreign body in respiratory tract, part unspecified causing other injury, subsequent encounter: Secondary | ICD-10-CM

## 2020-08-06 DIAGNOSIS — R1013 Epigastric pain: Secondary | ICD-10-CM

## 2020-08-06 NOTE — Progress Notes (Signed)
Virtual Visit via Video   I connected with patient on 08/06/20 at  3:40 PM EST by a video enabled telemedicine application and verified that I am speaking with the correct person using two identifiers.  Location patient: Home Location provider: [facility] Persons participating in the virtual visit: Patient, [name], NP  I discussed the limitations of evaluation and management by telemedicine and the availability of in person appointments. The patient expressed understanding and agreed to proceed.  Subjective:   HPI:  44 year old male with a longstanding history of GERD requests a visit today after being awakened from his sleep last night vomiting gasrtic contents. Also reports an increase in his heart rate and respirations while he was choking. He has been on multiple PPIs to include brand Nexium, Omeprazole. He has a history of aspiration PNA and was on a ventilator earlier this year as a result. Reports taking Protonix 1-2 daily consistently, elevating his head at night and watching his diet but continues to struggle with reflux. He believes his symptoms are better with a lower dose of Seroquel. Has increased stress. Drinks a cup of coffee in the morning and has recently decreased his caffeine. Last night he had a chocolate brownie that may have been a trigger. Suffers with epigastric pain daily. He is interested in Nissen [employer] or any procedure to decrease his reflux symptoms   Moved here from [place]. GI appoint with [facility] Dec 21st.  ROS:   See pertinent positives and negatives per HPI.  Patient Active Problem List   Diagnosis Date Noted  . Hypertriglyceridemia 05/10/2020  . Type 2 diabetes mellitus with hyperglycemia, without long-term current use of insulin ([place]) 05/10/2020  . Recovering alcoholic ([place]) 65/68/1275  . Depression with anxiety 05/10/2020  . Gastroesophageal reflux disease 05/10/2020  . Sepsis ([place]) 05/01/2020  . Essential  hypertension 05/01/2020  . Multifocal pneumonia   . Acute respiratory failure with hypoxia ([place]) 04/30/2020    Social History   Tobacco Use  . Smoking status: Former Research scientist (life sciences)  . Smokeless tobacco: Former Network engineer Use Topics  . Alcohol use: Not Currently    Alcohol/week: 4.0 standard drinks    Types: 1 Glasses of wine, 1 Cans of beer, 1 Shots of liquor, 1 Standard drinks or equivalent per week    Comment: no alcohol in 8-62months    Current Outpatient Medications:  .  fenofibrate 160 MG tablet, Take 1 tablet (160 mg total) by mouth daily., Disp: 90 tablet, Rfl: 3 .  hydrochlorothiazide (MICROZIDE) 12.5 MG capsule, Take 1 capsule (12.5 mg total) by mouth daily., Disp: 90 capsule, Rfl: 3 .  losartan (COZAAR) 25 MG tablet, Take 1 tablet (25 mg total) by mouth daily., Disp: 90 tablet, Rfl: 3 .  metFORMIN (GLUCOPHAGE XR) 500 MG 24 hr tablet, Take 1 tablet (500 mg total) by mouth daily with breakfast., Disp: 90 tablet, Rfl: 3 .  pantoprazole (PROTONIX) 40 MG tablet, Take 1 tablet (40 mg total) by mouth 2 (two) times daily., Disp: 60 tablet, Rfl: 1 .  QUEtiapine (SEROQUEL) 100 MG tablet, Take 1 tablet (100 mg total) by mouth at bedtime., Disp: 30 tablet, Rfl: 1 .  venlafaxine XR (EFFEXOR-XR) 75 MG 24 hr capsule, TAKE THREE CAPSULES BY MOUTH ONE TIME DAILY, Disp: 90 capsule, Rfl: 2  Allergies  Allergen Reactions  . Benzonatate Other (See Comments)    Objective:   Temp (!) 101 F (38.3 C) (Oral)   Ht 6\' 2"  (1.88 m)  Wt 226 lb (102.5 kg)   BMI 29.02 kg/m   Patient is well-developed, well-nourished in no acute distress.  Resting comfortably  at home in chair.  Head is normocephalic, atraumatic.  No labored breathing.  Speech is clear and coherent with logical content.  Patient is alert and oriented at baseline.    Assessment and Plan:    Kyle [name] was seen today for gastroesophageal reflux.  Diagnoses and all orders for this visit:  Gastroesophageal reflux disease without  esophagitis  Aspiration into airway, subsequent encounter  Epigastric pain  I am very concerned that patient is high risk for another aspiration PNA. We will try to get an earlier appointment with GI. Continue current medications. Watch spicy, fried foods, decreased caffeine, and stress. Call if symptoms worsen prior to seeing GI   Kennyth Arnold, FNP 08/06/2020

## 2020-08-16 ENCOUNTER — Encounter: Payer: Self-pay | Admitting: Family Medicine

## 2020-08-18 ENCOUNTER — Encounter: Payer: Self-pay | Admitting: Family Medicine

## 2020-08-18 NOTE — Telephone Encounter (Signed)
Pt informed of message from [place], below.  Pt informed me that he has already started his taper, per Dr. Loletha Grayer. Depression with anxiety - pt feels he has worked thru and dealt with many of the issues that were causing his depression and anxiety. He is now sober, in job he likes, lives close to his 2 children, and is in a healthy relationship Decrease: - QUEtiapine (SEROQUEL) 100 MG tablet; Take 1 tablet (100 mg total) by mouth at bedtime.  Dispense: 30 tablet; Refill: 1 - decreased from 150mg  to 100mg  qHS x 2 wks and then pt will send MyChart message to let me know how he is doing. If ok, will decrease to 50mg  qHS at that time - decreased effexor from 225mg  to 150mg  qHS. Pt will send MyChart message to let me know how he is doing. If ok, will decrease to 75mg  qHS at that time Pt stated that he felt fine after above decrease in medications.  Pt started 1/2 of the Seroqual last night and decreased to 1 capsule of the Effexor.  He has an New Pt appointment with Dr. Loletha Grayer in Jan.

## 2020-09-02 ENCOUNTER — Encounter: Payer: Self-pay | Admitting: Gastroenterology

## 2020-09-02 ENCOUNTER — Ambulatory Visit: Payer: 59 | Admitting: Gastroenterology

## 2020-09-02 VITALS — BP 134/72 | HR 95 | Ht 73.0 in | Wt 233.1 lb

## 2020-09-02 DIAGNOSIS — K449 Diaphragmatic hernia without obstruction or gangrene: Secondary | ICD-10-CM

## 2020-09-02 DIAGNOSIS — Z8701 Personal history of pneumonia (recurrent): Secondary | ICD-10-CM

## 2020-09-02 DIAGNOSIS — Z1211 Encounter for screening for malignant neoplasm of colon: Secondary | ICD-10-CM | POA: Diagnosis not present

## 2020-09-02 DIAGNOSIS — K219 Gastro-esophageal reflux disease without esophagitis: Secondary | ICD-10-CM

## 2020-09-02 DIAGNOSIS — Z1212 Encounter for screening for malignant neoplasm of rectum: Secondary | ICD-10-CM

## 2020-09-02 NOTE — Patient Instructions (Signed)
If you are age 45 or older, your body mass index should be between 23-30. Your Body mass index is 30.76 kg/m. If this is out of the aforementioned range listed, please consider follow up with your Primary Care Provider.  If you are age 25 or younger, your body mass index should be between 19-25. Your Body mass index is 30.76 kg/m. If this is out of the aformentioned range listed, please consider follow up with your Primary Care Provider.   We have sent the following medications to your pharmacy for you to pick up at your convenience:  Clenpiq for colonoscopy  Due to recent changes in healthcare laws, you may see the results of your imaging and laboratory studies on MyChart before your provider has had a chance to review them.  We understand that in some cases there may be results that are confusing or concerning to you. Not all laboratory results come back in the same time frame and the provider may be waiting for multiple results in order to interpret others.  Please give [place] 48 hours in order for your provider to thoroughly review all the results before contacting the office for clarification of your results.   Thank you for choosing me and [facility].  Vito Cirigliano, D.O.

## 2020-09-02 NOTE — Progress Notes (Signed)
Chief Complaint: GERD, hiatal hernia   Referring Provider:     Ronnald Nian, DO   HPI:     Kyle [name] is a 44 y.o. male history depression, anxiety, HTN, HLD, referred to the Gastroenterology Clinic for evaluation of reflux symptoms.  Has a longstanding history of reflux for the last 15-20 years, previously followed by GI in [place] prior to moving back to [place] in 03/2020. Index sxs of HB, regurgitation. Bothersome daytime sxs but also significant +nocturnal symptoms (choking, regurgitaiton, emesis).  Has even been hospitalized with aspiration PNA requiring intubation in 04/2020 presumed 2/2 nocturnal reflux.  Exacerbated by spicy foods, chocolate, coffee, supine, lying on right side.  Regurgitation with forward flexion.  Has trialed multiple medications in the past, to include Nexium, omeprazole, Tums, carafate.  Escalating doses over the years. Currently taking Protonix 40 mg bid which generally controls symptoms. Breakthrough with any missed doses.  Sleeps with HOB elevated, avoids eating within 3 hours of bedtime, and recently stopped evening caffeine intake.  Was recommended to consider antireflux surgery by his previous Gastroenterologist, but patient subsequently moved back to [place].  Labs on 05/12/2020 after hospital discharge from aspiration pneumonia notable for the following: -Normal CBC -Normal CMP  GERD-HRQL Questionnaire score: 35/50 (on PPI), and marked "dissatisfied" with current health condition   Endoscopic Hx: - EGD (03/2020, [facility], Dr. Tora Kindred): Z-line irregular at 39 cm (path: Reflux changes without intestinal metaplasia), 1 cm HH, normal stomach and duodenum -Reports having EGD years ago in [place]; cannot recall results  Past Medical History:  Diagnosis Date  . Allergy   . Anxiety    patient states history   . Depression    history   . Hyperlipidemia   . Hypertension      Past Surgical History:  Procedure  Laterality Date  . ESOPHAGOGASTRODUODENOSCOPY     2021 [facility]  . left leg surgery    . SINUSOTOMY    . TONSILLECTOMY AND ADENOIDECTOMY     Family History  Problem Relation Age of Onset  . Healthy Mother   . Hypertension Father   . Hyperlipidemia Father   . Prostate cancer Father   . Bladder Cancer Father   . Birth defects Maternal Grandfather   . Bone cancer Maternal Grandfather   . Birth defects Paternal Grandfather   . Cancer Maternal Uncle    Social History   Tobacco Use  . Smoking status: Former Research scientist (life sciences)  . Smokeless tobacco: Former Network engineer  . Vaping Use: Every day  Substance Use Topics  . Alcohol use: Not Currently    Alcohol/week: 4.0 standard drinks    Types: 1 Glasses of wine, 1 Cans of beer, 1 Shots of liquor, 1 Standard drinks or equivalent per week    Comment: no alcohol in 8-51months  . Drug use: Not Currently    Types: Marijuana   Current Outpatient Medications  Medication Sig Dispense Refill  . fenofibrate 160 MG tablet Take 1 tablet (160 mg total) by mouth daily. 90 tablet 3  . hydrochlorothiazide (MICROZIDE) 12.5 MG capsule Take 1 capsule (12.5 mg total) by mouth daily. 90 capsule 3  . losartan (COZAAR) 25 MG tablet Take 1 tablet (25 mg total) by mouth daily. 90 tablet 3  . metFORMIN (GLUCOPHAGE XR) 500 MG 24 hr tablet Take 1 tablet (500 mg total) by mouth daily with breakfast. 90 tablet 3  .  pantoprazole (PROTONIX) 40 MG tablet Take 1 tablet (40 mg total) by mouth 2 (two) times daily. 60 tablet 1  . QUEtiapine (SEROQUEL) 50 MG tablet Take 50 mg by mouth at bedtime.    Marland Kitchen venlafaxine XR (EFFEXOR-XR) 75 MG 24 hr capsule TAKE THREE CAPSULES BY MOUTH ONE TIME DAILY (Patient taking differently: Take 75 mg by mouth daily.) 90 capsule 2   No current facility-administered medications for this visit.   Allergies  Allergen Reactions  . Benzonatate Other (See Comments)     Review of Systems: All systems reviewed and negative  except where noted in HPI.     Physical Exam:    Wt Readings from Last 3 Encounters:  09/02/20 233 lb 2 oz (105.7 kg)  08/06/20 226 lb (102.5 kg)  07/28/20 231 lb 6.4 oz (105 kg)    BP 134/72   Pulse 95   Ht 6\' 1"  (1.854 m)   Wt 233 lb 2 oz (105.7 kg)   BMI 30.76 kg/m  Constitutional:  Pleasant, in no acute distress. Psychiatric: Normal mood and affect. Behavior is normal. Abdominal: Soft, nondistended Neurological: Alert and oriented to person place and time. Skin: Skin is warm and dry. No rashes noted.   ASSESSMENT AND PLAN;   1) GERD 2) Hiatal hernia 3) History of aspiration pneumonia 4) Regurgitation  44 year old male with longstanding history of GERD with at least histologic evidence of reflux changes on prior endoscopy, presents to discuss antireflux surgical options as a means to better control his reflux and stop or significantly reduce the need for acid suppression therapy.  Currently on high-dose Protonix, and still with intermittent breakthrough symptoms along with regular nocturnal symptoms.  Suspect significant LES laxity and anatomic disruption.  Discussed pathophysiology of reflux at length today, to include treatment options of continued medical management vs antireflux surgical options, to include TIF, Nissen, partial toupee, etc. and will proceed as follows:  -EGD for preoperative assessment along with evaluation of size/grade of hiatal hernia, degree of LES laxity, presence of erosive esophagitis, etc. -Continue Protonix as prescribed -Continue antireflux lifestyle/dietary modifications -Patient thinks he would be unable to hold PPI for 5+ days in order to complete Bravo, pH/impedance testing, etc.  If EGD otherwise unrevealing, plan for barium esophagram as next step -RTC after EGD to discuss ongoing treatment plan  5) Colon cancer screening -Due for age-appropriate, average risk CRC screening -Schedule colonoscopy at time of EGD for routine  screening  The indications, risks, and benefits of EGD and colonoscopy were explained to the patient in detail. Risks include but are not limited to bleeding, perforation, adverse reaction to medications, and cardiopulmonary compromise. Sequelae include but are not limited to the possibility of surgery, hositalization, and mortality. The patient verbalized understanding and wished to proceed. All questions answered, referred to scheduler and bowel prep ordered. Further recommendations pending results of the exam.     Lavena Bullion, DO, FACG  09/02/2020, 2:42 PM   Caden Fukushima, Garvin Fila, DO

## 2020-09-04 ENCOUNTER — Encounter: Payer: Self-pay | Admitting: Family Medicine

## 2020-09-10 ENCOUNTER — Encounter: Payer: Self-pay | Admitting: Family Medicine

## 2020-09-10 ENCOUNTER — Telehealth (INDEPENDENT_AMBULATORY_CARE_PROVIDER_SITE_OTHER): Payer: 59 | Admitting: Family Medicine

## 2020-09-10 VITALS — Temp 97.8°F | Wt 227.0 lb

## 2020-09-10 DIAGNOSIS — F418 Other specified anxiety disorders: Secondary | ICD-10-CM | POA: Diagnosis not present

## 2020-09-10 MED ORDER — VENLAFAXINE HCL ER 37.5 MG PO CP24: 37.5000 mg | ORAL_CAPSULE | Freq: Every day | ORAL | 0 refills | Status: DC

## 2020-09-10 NOTE — Patient Instructions (Signed)
Take 37.5mg  1 tab daily x 2 weeks After 3 weeks take 1 tab every other day x 2 weeks Then 1 tab every 4th days x 2 wks Then can stop med

## 2020-09-10 NOTE — Progress Notes (Signed)
Virtual Visit via Video Note  I connected with Kyle [name] on 09/10/20 at 11:00 AM EST by a video enabled telemedicine application and verified that I am speaking with the correct person using two identifiers. Location patient: home Location provider: work Persons participating in the virtual visit: patient, provider  I discussed the limitations of evaluation and management by telemedicine and the availability of in person appointments. The patient expressed understanding and agreed to proceed.  Chief Complaint  Patient presents with  . Follow-up    F/u meds.  Reports stopping both  Quetiapine and Venlafaxine on Saturday (09/04/20)     HPI: Kyle [name] is a 44 y.o. male who has been tapering off of his medications seroquel and effexor. He last dose of these meds on Sat 09/04/20. Since Sunday, he is experiencing what are likely side effects from stopping effexor. He has been down to 75mg daily and then stopped from that dose. He is feeling "jolts" as frequent as every 30 sec and he is having trouble falling asleep at night due to this. He is otherwise doing well off the meds.   Past Medical History:  Diagnosis Date  . Allergy   . Anxiety    patient states history   . Depression    history   . Hyperlipidemia   . Hypertension     Past Surgical History:  Procedure Laterality Date  . ESOPHAGOGASTRODUODENOSCOPY     20 21 [facility]  . left leg surgery    . SINUSOTOMY    . TONSILLECTOMY AND ADENOIDECTOMY      Family History  Problem Relation Age of Onset  . Healthy Mother   . Hypertension Father   . Hyperlipidemia Father   . Prostate cancer Father   . Bladder Cancer Father   . Birth defects Maternal Grandfather   . Bone cancer Maternal Grandfather   . Birth defects Paternal Grandfather   . Cancer Maternal Uncle     Social History   Tobacco Use  . Smoking status: Former Research scientist (life sciences)  . Smokeless tobacco: Former Network engineer  . Vaping Use: Every day   Substance Use Topics  . Alcohol use: Not Currently    Alcohol/week: 4.0 standard drinks    Types: 1 Glasses of wine, 1 Cans of beer, 1 Shots of liquor, 1 Standard drinks or equivalent per week    Comment: no alcohol in 8-1months  . Drug use: Not Currently    Types: Marijuana     Current Outpatient Medications:  .  fenofibrate 160 MG tablet, Take 1 tablet (160 mg total) by mouth daily., Disp: 90 tablet, Rfl: 3 .  hydrochlorothiazide (MICROZIDE) 12.5 MG capsule, Take 1 capsule (12.5 mg total) by mouth daily., Disp: 90 capsule, Rfl: 3 .  metFORMIN (GLUCOPHAGE XR) 500 MG 24 hr tablet, Take 1 tablet (500 mg total) by mouth daily with breakfast., Disp: 90 tablet, Rfl: 3 .  pantoprazole (PROTONIX) 40 MG tablet, Take 1 tablet (40 mg total) by mouth 2 (two) times daily., Disp: 60 tablet, Rfl: 1 .  losartan (COZAAR) 25 MG tablet, Take 1 tablet (25 mg total) by mouth daily., Disp: 90 tablet, Rfl: 3 .  QUEtiapine (SEROQUEL) 50 MG tablet, Take 50 mg by mouth at bedtime. (Patient not taking: Reported on 09/10/2020), Disp: , Rfl:  .  venlafaxine XR (EFFEXOR-XR) 75 MG 24 hr capsule, TAKE THREE CAPSULES BY MOUTH ONE TIME DAILY (Patient not taking: Reported on 09/10/2020), Disp: 90 capsule, Rfl: 2  Allergies  Allergen Reactions  . Benzonatate Other (See Comments)      ROS: See pertinent positives and negatives per HPI.   EXAM:  VITALS per patient if applicable: Temp 56.8 F (36.6 C) Comment: pt reported.  Wt 227 lb (103 kg) Comment: pt reported  BMI 29.95 kg/m    GENERAL: alert, oriented, appears well and in no acute distress  HEENT: atraumatic, conjunctiva clear, no obvious abnormalities on inspection of external nose and ears  NECK: normal movements of the head and neck  LUNGS: on inspection no signs of respiratory distress, breathing rate appears normal, no obvious gross SOB, gasping or wheezing, no conversational dyspnea  CV: no obvious cyanosis  PSYCH/NEURO: pleasant and  cooperative, no obvious depression or anxiety, speech and thought processing grossly intact   ASSESSMENT AND PLAN: 1. Depression with anxiety - side effects when he stopped effexor 75mg  daily - will restart at 37.5mg  daily x 3 weeks then do very slow taper (discussed with pt and included in AVS) Rx: - venlafaxine XR (EFFEXOR XR) 37.5 MG 24 hr capsule; Take 1 capsule (37.5 mg total) by mouth daily with breakfast.  Dispense: 90 capsule; Refill: 0 - pt has appt scheduled with me on 10/15/19 so can f/u at that time or sooner PRN   I discussed the assessment and treatment plan with the patient. The patient was provided an opportunity to ask questions and all were answered. The patient agreed with the plan and demonstrated an understanding of the instructions.   The patient was advised to call back or seek an in-person evaluation if the symptoms worsen or if the condition fails to improve as anticipated.   Letta Median, DO

## 2020-09-14 ENCOUNTER — Ambulatory Visit: Payer: 59 | Admitting: Gastroenterology

## 2020-09-14 ENCOUNTER — Telehealth: Payer: Self-pay | Admitting: General Surgery

## 2020-09-15 MED ORDER — CLENPIQ 10-3.5-12 MG-GM -GM/160ML PO SOLN: 1.0000 | ORAL | 0 refills | Status: DC

## 2020-09-15 NOTE — Telephone Encounter (Signed)
Contacted patients pharmacy and spoke with [facility], [facility]- she is not showing any allergy or contraindication between Benzonatate and clenpiq, okay to send in.

## 2020-09-23 ENCOUNTER — Encounter: Payer: Self-pay | Admitting: Gastroenterology

## 2020-09-30 ENCOUNTER — Other Ambulatory Visit: Payer: Self-pay | Admitting: Gastroenterology

## 2020-09-30 ENCOUNTER — Other Ambulatory Visit: Payer: Self-pay

## 2020-09-30 ENCOUNTER — Ambulatory Visit (AMBULATORY_SURGERY_CENTER): Payer: 59 | Admitting: Gastroenterology

## 2020-09-30 ENCOUNTER — Encounter: Payer: Self-pay | Admitting: Gastroenterology

## 2020-09-30 VITALS — BP 100/70 | HR 62 | Temp 96.6°F | Resp 15 | Ht 73.0 in | Wt 233.0 lb

## 2020-09-30 DIAGNOSIS — K449 Diaphragmatic hernia without obstruction or gangrene: Secondary | ICD-10-CM

## 2020-09-30 DIAGNOSIS — K640 First degree hemorrhoids: Secondary | ICD-10-CM

## 2020-09-30 DIAGNOSIS — K219 Gastro-esophageal reflux disease without esophagitis: Secondary | ICD-10-CM

## 2020-09-30 DIAGNOSIS — K635 Polyp of colon: Secondary | ICD-10-CM | POA: Diagnosis not present

## 2020-09-30 DIAGNOSIS — R12 Heartburn: Secondary | ICD-10-CM

## 2020-09-30 DIAGNOSIS — Z1211 Encounter for screening for malignant neoplasm of colon: Secondary | ICD-10-CM

## 2020-09-30 DIAGNOSIS — K317 Polyp of stomach and duodenum: Secondary | ICD-10-CM

## 2020-09-30 DIAGNOSIS — D125 Benign neoplasm of sigmoid colon: Secondary | ICD-10-CM

## 2020-09-30 MED ORDER — SODIUM CHLORIDE 0.9 % IV SOLN: 500.0000 mL | Freq: Once | INTRAVENOUS | Status: DC

## 2020-09-30 NOTE — Patient Instructions (Signed)
Handouts provided on hiatal hernia, polyps, hemorrhoids and hemorrhoid banding.  Perform a barium esophagram at appointment to be scheduled my office will contact you.   Return to GI clinic at appointment to be scheduled. Will plan to discuss antireflux surgical options, to include concomitant laparoscopic hiatal hernia repair and Transoral Incisionless Fundoplicatin (TIF).   Internal hemorrhoids were noted on this study and may be amenable to hemorrhoid band ligation. If you are interested in further treatment of these hemorrhoids with band ligation, please contact my clinic to set up an appointment for evaluation and treatment.    YOU HAD AN ENDOSCOPIC PROCEDURE TODAY AT THE [facility]:   Refer to the procedure report that was given to you for any specific questions about what was found during the examination.  If the procedure report does not answer your questions, please call your gastroenterologist to clarify.  If you requested that your care partner not be given the details of your procedure findings, then the procedure report has been included in a sealed envelope for you to review at your convenience later.  YOU SHOULD EXPECT: Some feelings of bloating in the abdomen. Passage of more gas than usual.  Walking can help get rid of the air that was put into your GI tract during the procedure and reduce the bloating. If you had a lower endoscopy (such as a colonoscopy or flexible sigmoidoscopy) you may notice spotting of blood in your stool or on the toilet paper. If you underwent a bowel prep for your procedure, you may not have a normal bowel movement for a few days.  Please Note:  You might notice some irritation and congestion in your nose or some drainage.  This is from the oxygen used during your procedure.  There is no need for concern and it should clear up in a day or so.  SYMPTOMS TO REPORT IMMEDIATELY:   Following lower endoscopy (colonoscopy or flexible  sigmoidoscopy):  Excessive amounts of blood in the stool  Significant tenderness or worsening of abdominal pains  Swelling of the abdomen that is new, acute  Fever of 100F or higher   Following upper endoscopy (EGD)  Vomiting of blood or coffee ground material  New chest pain or pain under the shoulder blades  Painful or persistently difficult swallowing  New shortness of breath  Fever of 100F or higher  Black, tarry-looking stools  For urgent or emergent issues, a gastroenterologist can be reached at any hour by calling [phone]. Do not use MyChart messaging for urgent concerns.    DIET:  We do recommend a small meal at first, but then you may proceed to your regular diet.  Drink plenty of fluids but you should avoid alcoholic beverages for 24 hours.  ACTIVITY:  You should plan to take it easy for the rest of today and you should NOT DRIVE or use heavy machinery until tomorrow (because of the sedation medicines used during the test).    FOLLOW UP: Our staff will call the number listed on your records 48-72 hours following your procedure to check on you and address any questions or concerns that you may have regarding the information given to you following your procedure. If we do not reach you, we will leave a message.  We will attempt to reach you two times.  During this call, we will ask if you have developed any symptoms of COVID 19. If you develop any symptoms (ie: fever, flu-like symptoms, shortness of breath, cough etc.)  before then, please call [phone].  If you test positive for Covid 19 in the 2 weeks post procedure, please call and report this information to [place].    If any biopsies were taken you will be contacted by phone or by letter within the next 1-3 weeks.  Please call us at [phone] if you have not heard about the biopsies in 3 weeks.    SIGNATURES/CONFIDENTIALITY: You and/or your care partner have signed paperwork which will be entered into your  electronic medical record.  These signatures attest to the fact that that the information above on your After Visit Summary has been reviewed and is understood.  Full responsibility of the confidentiality of this discharge information lies with you and/or your care-partner.

## 2020-09-30 NOTE — Progress Notes (Signed)
Called to room to assist during endoscopic procedure.  Patient ID and intended procedure confirmed with present staff. Received instructions for my participation in the procedure from the performing physician.  

## 2020-09-30 NOTE — Op Note (Signed)
[place] Patient Name: Kyle [name] Procedure Date: 09/30/2020 10:42 AM MRN: [id number] Endoscopist: Gerrit Heck , MD Age: 45 Referring MD:  Date of Birth: 1975/11/06 Gender: Male Account #: [id number] Procedure:                Upper GI endoscopy Indications:              Heartburn, Suspected esophageal reflux,                            Preoperative assessment for possible hiatal hernia                            hernia and fundoplication Medicines:                Monitored Anesthesia Care Procedure:                Pre-Anesthesia Assessment:                           - Prior to the procedure, a History and Physical                            was performed, and patient medications and                            allergies were reviewed. The patient's tolerance of                            previous anesthesia was also reviewed. The risks                            and benefits of the procedure and the sedation                            options and risks were discussed with the patient.                            All questions were answered, and informed consent                            was obtained. Prior Anticoagulants: The patient has                            taken no previous anticoagulant or antiplatelet                            agents. ASA Grade Assessment: II - A patient with                            mild systemic disease. After reviewing the risks                            and benefits, the patient was deemed in  satisfactory condition to undergo the procedure.                           After obtaining informed consent, the endoscope was                            passed under direct vision. Throughout the                            procedure, the patient's blood pressure, pulse, and                            oxygen saturations were monitored continuously. The                            Endoscope was introduced through the  mouth, and                            advanced to the second part of duodenum. The upper                            GI endoscopy was accomplished without difficulty.                            The patient tolerated the procedure well. Scope In: Scope Out: Findings:                 A 3 cm hiatal hernia was present.                           The Z-line was regular and was found 37 cm from the                            incisors.                           The gastroesophageal flap valve was visualized                            endoscopically and classified as Hill Grade IV (no                            fold, wide open lumen, hiatal hernia present).                           A few small sessile polyps with no stigmata of                            recent bleeding were found in the gastric fundus                            and in the gastric body. A few of these polyps were  removed with a cold biopsy forceps for histologic                            representative evaluation. Resection and retrieval                            were complete. Estimated blood loss was minimal.                           The incisura, gastric antrum and pylorus were                            normal.                           The examined duodenum was normal. Complications:            No immediate complications. Estimated Blood Loss:     Estimated blood loss was minimal. Impression:               - 3 cm hiatal hernia.                           - Z-line regular, 37 cm from the incisors.                           - Gastroesophageal flap valve classified as Hill                            Grade IV (no fold, wide open lumen, hiatal hernia                            present).                           - A few gastric polyps. Resected and retrieved.                           - Normal incisura, antrum and pylorus.                           - Normal examined duodenum. Recommendation:            - Patient has a contact number available for                            emergencies. The signs and symptoms of potential                            delayed complications were discussed with the                            patient. Return to normal activities tomorrow.                            Written discharge instructions were provided to the  patient.                           - Resume previous diet.                           - Continue present medications.                           - Await pathology results.                           - Perform a barium esophagram at appointment to be                            scheduled.                           - Return to GI clinic at appointment to be                            scheduled. Will plan to discuss antireflux surgical                            options, to include concomitant laparoscopic hiatal                            hernia repair and Transoral Incisionless                            Fundoplication (TIF).                           -Colonoscopy today. Gerrit Heck, MD 09/30/2020 11:15:38 AM

## 2020-09-30 NOTE — Op Note (Signed)
[facility] Patient Name: Kyle [name] Procedure Date: 09/30/2020 10:41 AM MRN: [id number] Endoscopist: Doristine Locks , MD Age: 45 Referring MD:  Date of Birth: 1976-04-13 Gender: Male Account #: [id number] Procedure:                Colonoscopy Indications:              Screening for colorectal malignant neoplasm, This                            is the patient's first colonoscopy Medicines:                Monitored Anesthesia Care Procedure:                Pre-Anesthesia Assessment:                           - Prior to the procedure, a History and Physical                            was performed, and patient medications and                            allergies were reviewed. The patient's tolerance of                            previous anesthesia was also reviewed. The risks                            and benefits of the procedure and the sedation                            options and risks were discussed with the patient.                            All questions were answered, and informed consent                            was obtained. Prior Anticoagulants: The patient has                            taken no previous anticoagulant or antiplatelet                            agents. ASA Grade Assessment: II - A patient with                            mild systemic disease. After reviewing the risks                            and benefits, the patient was deemed in                            satisfactory condition to undergo the procedure.  After obtaining informed consent, the colonoscope                            was passed under direct vision. Throughout the                            procedure, the patient's blood pressure, pulse, and                            oxygen saturations were monitored continuously. The                            Olympus CF-HQ190L (Serial# [id number]) Colonoscope was                            introduced through the anus  and advanced to the the                            terminal ileum. The colonoscopy was performed                            without difficulty. The patient tolerated the                            procedure well. The quality of the bowel                            preparation was good. The terminal ileum, ileocecal                            valve, appendiceal orifice, and rectum were                            photographed. Scope In: 10:53:29 AM Scope Out: 11:08:35 AM Scope Withdrawal Time: 0 hours 12 minutes 37 seconds  Total Procedure Duration: 0 hours 15 minutes 6 seconds  Findings:                 The perianal and digital rectal examinations were                            normal.                           A 6 mm polyp was found in the sigmoid colon. The                            polyp was sessile. The polyp was removed with a                            cold snare. Resection and retrieval were complete.                            Estimated blood loss was minimal.  Non-bleeding internal hemorrhoids were found during                            retroflexion. The hemorrhoids were small.                           The terminal ileum appeared normal. Complications:            No immediate complications. Estimated Blood Loss:     Estimated blood loss was minimal. Impression:               - One 6 mm polyp in the sigmoid colon, removed with                            a cold snare. Resected and retrieved.                           - Non-bleeding internal hemorrhoids.                           - The examined portion of the ileum was normal. Recommendation:           - Patient has a contact number available for                            emergencies. The signs and symptoms of potential                            delayed complications were discussed with the                            patient. Return to normal activities tomorrow.                            Written  discharge instructions were provided to the                            patient.                           - Resume previous diet.                           - Continue present medications.                           - Await pathology results.                           - Repeat colonoscopy in 5-10 years for surveillance                            based on pathology results.                           - Return to GI office at appointment to be  scheduled.                           - Internal hemorrhoids were noted on this study and                            may be amenable to hemorrhoid band ligation. If you                            are interested in further treatment of these                            hemorrhoids with band ligation, please contact my                            clinic to set up an appointment for evaluation and                            treatment. Gerrit Heck, MD 09/30/2020 11:19:12 AM

## 2020-09-30 NOTE — Progress Notes (Signed)
Pt's states no medical or surgical changes since previsit or office visit. 

## 2020-09-30 NOTE — Progress Notes (Signed)
Report to PACU, RN, vss, BBS= Clear.  

## 2020-10-04 ENCOUNTER — Telehealth: Payer: Self-pay

## 2020-10-04 NOTE — Telephone Encounter (Signed)
Covid-19 screening questions   Do you now or have you had a fever in the last 14 days? No.  Do you have any respiratory symptoms of shortness of breath or cough now or in the last 14 days? No.  Do you have any family members or close contacts with diagnosed or suspected Covid-19 in the past 14 days? No.  Have you been tested for Covid-19 and found to be positive? No.       Follow up Call-  Call back number 09/30/2020  Post procedure Call Back phone  # [phone]  Permission to leave phone message Yes     Patient questions:  Do you have a fever, pain , or abdominal swelling? No. Pain Score  0 *  Have you tolerated food without any problems? Yes.    Have you been able to return to your normal activities? Yes.    Do you have any questions about your discharge instructions: Diet   No. Medications  No. Follow up visit  No.  Do you have questions or concerns about your Care? No.  Actions: * If pain score is 4 or above: No action needed, pain <4.

## 2020-10-07 ENCOUNTER — Encounter: Payer: Self-pay | Admitting: Gastroenterology

## 2020-10-07 ENCOUNTER — Other Ambulatory Visit: Payer: Self-pay | Admitting: Family Medicine

## 2020-10-07 DIAGNOSIS — K219 Gastro-esophageal reflux disease without esophagitis: Secondary | ICD-10-CM

## 2020-10-07 NOTE — Telephone Encounter (Signed)
Last VV 09/10/20 Last fill 07/28/20  #60/1 Next OV 10/14/20

## 2020-10-08 ENCOUNTER — Other Ambulatory Visit: Payer: Self-pay

## 2020-10-08 ENCOUNTER — Ambulatory Visit (HOSPITAL_COMMUNITY)
Admission: RE | Admit: 2020-10-08 | Discharge: 2020-10-08 | Disposition: A | Discharge status: Home / Self Care | Payer: 59 | Source: Ambulatory Visit | Attending: Gastroenterology | Admitting: Gastroenterology

## 2020-10-08 DIAGNOSIS — K219 Gastro-esophageal reflux disease without esophagitis: Secondary | ICD-10-CM | POA: Insufficient documentation

## 2020-10-13 ENCOUNTER — Other Ambulatory Visit: Payer: Self-pay

## 2020-10-14 ENCOUNTER — Encounter: Payer: Self-pay | Admitting: Family Medicine

## 2020-10-14 ENCOUNTER — Ambulatory Visit (INDEPENDENT_AMBULATORY_CARE_PROVIDER_SITE_OTHER): Payer: 59 | Admitting: Family Medicine

## 2020-10-14 VITALS — BP 120/72 | HR 74 | Temp 97.0°F | Ht 73.5 in | Wt 224.4 lb

## 2020-10-14 DIAGNOSIS — E1165 Type 2 diabetes mellitus with hyperglycemia: Secondary | ICD-10-CM

## 2020-10-14 DIAGNOSIS — Z0000 Encounter for general adult medical examination without abnormal findings: Secondary | ICD-10-CM | POA: Diagnosis not present

## 2020-10-14 DIAGNOSIS — E781 Pure hyperglyceridemia: Secondary | ICD-10-CM

## 2020-10-14 DIAGNOSIS — F418 Other specified anxiety disorders: Secondary | ICD-10-CM

## 2020-10-14 DIAGNOSIS — I10 Essential (primary) hypertension: Secondary | ICD-10-CM

## 2020-10-14 LAB — CBC
HCT: 39 % (ref 39.0–52.0)
Hemoglobin: 13.2 g/dL (ref 13.0–17.0)
MCHC: 33.7 g/dL (ref 30.0–36.0)
MCV: 84 fl (ref 78.0–100.0)
Platelets: 199 10*3/uL (ref 150.0–400.0)
RBC: 4.65 Mil/uL (ref 4.22–5.81)
RDW: 15.2 % (ref 11.5–15.5)
WBC: 3.8 10*3/uL — ABNORMAL LOW (ref 4.0–10.5)

## 2020-10-14 LAB — LIPID PANEL
Cholesterol: 129 mg/dL (ref 0–200)
HDL: 62 mg/dL (ref >39.00)
LDL Cholesterol: 56 mg/dL (ref 0–99)
NonHDL: 66.51
Total CHOL/HDL Ratio: 2
Triglycerides: 53 mg/dL (ref 0.0–149.0)
VLDL: 10.6 mg/dL (ref 0.0–40.0)

## 2020-10-14 LAB — COMPREHENSIVE METABOLIC PANEL
ALT: 11 U/L (ref 0–53)
AST: 14 U/L (ref 0–37)
Albumin: 4.5 g/dL (ref 3.5–5.2)
Alkaline Phosphatase: 40 U/L (ref 39–117)
BUN: 16 mg/dL (ref 6–23)
CO2: 29 mEq/L (ref 19–32)
Calcium: 9.6 mg/dL (ref 8.4–10.5)
Chloride: 105 mEq/L (ref 96–112)
Creatinine, Ser: 1.06 mg/dL (ref 0.40–1.50)
GFR: 84.85 mL/min (ref >60.00)
Glucose, Bld: 113 mg/dL — ABNORMAL HIGH (ref 70–99)
Potassium: 3.9 mEq/L (ref 3.5–5.1)
Sodium: 139 mEq/L (ref 135–145)
Total Bilirubin: 0.4 mg/dL (ref 0.2–1.2)
Total Protein: 6.8 g/dL (ref 6.0–8.3)

## 2020-10-14 LAB — HEMOGLOBIN A1C: Hgb A1c MFr Bld: 6.3 % (ref 4.6–6.5)

## 2020-10-14 NOTE — Patient Instructions (Addendum)
  [facility] [address], [place], [place] [address] [phone] [email or web address]  [facility] [address], [place],  [address] [phone] [email or web address]    

## 2020-10-14 NOTE — Progress Notes (Signed)
Kyle [name] is a 45 y.o. male  Chief Complaint  Patient presents with  . Establish Care    TOC-  CPE/labs. No concerns.     HPI: Kyle [name] is a 45 y.o. male seen today for annual CPE, fasting labs.  Pt denies any acute issues or concerns.   Last Colonoscopy: 09/30/2020 - Dr. Barron Alvine - due in 09/2030. Also had EGD at that time.   Diet/Exercise: pt has lost 10lbs since end of 08/2020 with improved diet (Noom) and exercise (10,000 steps per day, going to gym daily and walking at lunch) Dental: overdue Vision: no glasses or contacts; some issues driving at night.    Past Medical History:  Diagnosis Date  . Allergy   . Anxiety    patient states history   . Depression    history   . Hyperlipidemia   . Hypertension     Past Surgical History:  Procedure Laterality Date  . ESOPHAGOGASTRODUODENOSCOPY     2021 [facility]  . left leg surgery    . SINUSOTOMY    . TONSILLECTOMY AND ADENOIDECTOMY      Social History   Socioeconomic History  . Marital status: Media planner    Spouse name: Not on file  . Number of children: Not on file  . Years of education: Not on file  . Highest education level: Not on file  Occupational History  . Not on file  Tobacco Use  . Smoking status: Former Games developer  . Smokeless tobacco: Former Clinical biochemist  . Vaping Use: Every day  Substance and Sexual Activity  . Alcohol use: Not Currently    Alcohol/week: 4.0 standard drinks    Types: 1 Glasses of wine, 1 Cans of beer, 1 Shots of liquor, 1 Standard drinks or equivalent per week    Comment: no alcohol in 8-69months  . Drug use: Not Currently    Types: Marijuana  . Sexual activity: Yes  Other Topics Concern  . Not on file  Social History Narrative  . Not on file   Social Determinants of Health   Financial Resource Strain: Not on file  Food Insecurity: Not on file  Transportation Needs: Not on file  Physical Activity: Not on file  Stress: Not on file   Social Connections: Not on file  Intimate Partner Violence: Not on file    Family History  Problem Relation Age of Onset  . Healthy Mother   . Hypertension Father   . Hyperlipidemia Father   . Prostate cancer Father   . Bladder Cancer Father   . Birth defects Maternal Grandfather   . Bone cancer Maternal Grandfather   . Birth defects Paternal Grandfather   . Cancer Maternal Uncle      Immunization History  Administered Date(s) Administered  . Moderna Sars-Covid-2 Vaccination 04/18/2020, 05/16/2020  . Pneumococcal Polysaccharide-23 05/16/2020  . Tdap 05/16/2020    Outpatient Encounter Medications as of 10/14/2020  Medication Sig  . fenofibrate 160 MG tablet Take 1 tablet (160 mg total) by mouth daily.  . hydrochlorothiazide (MICROZIDE) 12.5 MG capsule Take 1 capsule (12.5 mg total) by mouth daily.  Marland Kitchen losartan (COZAAR) 25 MG tablet Take 1 tablet (25 mg total) by mouth daily.  . metFORMIN (GLUCOPHAGE XR) 500 MG 24 hr tablet Take 1 tablet (500 mg total) by mouth daily with breakfast.  . pantoprazole (PROTONIX) 40 MG tablet TAKE ONE TABLET BY MOUTH TWICE A DAY  . venlafaxine XR (EFFEXOR XR) 37.5 MG  24 hr capsule Take 1 capsule (37.5 mg total) by mouth daily with breakfast.   No facility-administered encounter medications on file as of 10/14/2020.     ROS: Gen: no fever, chills  Skin: no rash, itching ENT: no ear pain, ear drainage, nasal congestion, rhinorrhea, sinus pressure, sore throat Eyes: no blurry vision, double vision Resp: no cough, wheeze,SOB CV: no CP, palpitations, LE edema,  GI: no heartburn, n/v/d/c, abd pain GU: no dysuria, urgency, frequency, hematuria; no testicular swelling or masses MSK: no joint pain, myalgias, back pain Neuro: no dizziness, headache, weakness Psych: as above in HPI   Allergies  Allergen Reactions  . Benzonatate Other (See Comments)    BP 120/72   Pulse 74   Temp (!) 97 F (36.1 C) (Temporal)   Ht 6' 1.5" (1.867 m)   Wt 224  lb 6.4 oz (101.8 kg)   SpO2 97%   BMI 29.20 kg/m    BP Readings from Last 3 Encounters:  10/14/20 120/72  09/30/20 100/70  09/02/20 134/72   Pulse Readings from Last 3 Encounters:  10/14/20 74  09/30/20 62  09/02/20 95   Wt Readings from Last 3 Encounters:  10/14/20 224 lb 6.4 oz (101.8 kg)  09/30/20 233 lb (105.7 kg)  09/10/20 227 lb (103 kg)     Physical Exam Constitutional:      General: He is not in acute distress.    Appearance: He is well-developed and well-nourished.  HENT:     Head: Normocephalic and atraumatic.     Right Ear: Tympanic membrane and ear canal normal.     Left Ear: Tympanic membrane and ear canal normal.     Nose: Nose normal.     Mouth/Throat:     Mouth: Oropharynx is clear and moist and mucous membranes are normal.  Neck:     Thyroid: No thyromegaly.  Cardiovascular:     Rate and Rhythm: Normal rate and regular rhythm.     Pulses: Intact distal pulses.     Heart sounds: Normal heart sounds.  Pulmonary:     Effort: Pulmonary effort is normal.     Breath sounds: Normal breath sounds. No wheezing or rhonchi.  Abdominal:     General: Bowel sounds are normal. There is no distension.     Palpations: Abdomen is soft. There is no mass.     Tenderness: There is no abdominal tenderness. There is no guarding or rebound.  Musculoskeletal:        General: No edema.     Cervical back: Neck supple.     Right lower leg: No edema.     Left lower leg: No edema.  Lymphadenopathy:     Cervical: No cervical adenopathy.  Skin:    General: Skin is warm and dry.  Neurological:     Mental Status: He is alert and oriented to person, place, and time.     Motor: No abnormal muscle tone.     Coordination: Coordination normal.  Psychiatric:        Mood and Affect: Mood and affect normal.      A/P:  1. Annual physical exam - discussed importance of regular CV exercise, healthy diet, adequate sleep - UTD on colonoscopy - due for dental and vision exams -  Comprehensive metabolic panel - CBC - Lipid panel - next CPE in 1 year  2. Type 2 diabetes mellitus with hyperglycemia, without long-term current use of insulin [facility])  Lab Results  Component Value Date  HGBA1C 6.7 (H) 05/12/2020   - controlled, cont metformin 500mg  daily - Lipid panel - Hemoglobin A1c - Microalbumin / creatinine urine ratio  3. Hypertriglyceridemia - on fenofibrate 160mg  daily - Lipid panel  4. Essential hypertension - controlled, at goal - cont losartan 25mg  daily HCTZ 12.5mg  daily - Comprehensive metabolic panel - CBC  5. Depression with anxiety - working to taper off of effexor    This visit occurred during the SARS-CoV-2 public health emergency.  Safety protocols were in place, including screening questions prior to the visit, additional usage of staff PPE, and extensive cleaning of exam room while observing appropriate contact time as indicated for disinfecting solutions.

## 2020-10-14 NOTE — Addendum Note (Signed)
Addended by: Lynnea Ferrier on: 10/14/2020 09:05 AM   Modules accepted: Orders

## 2020-10-26 ENCOUNTER — Encounter: Payer: Self-pay | Admitting: Family Medicine

## 2020-11-11 ENCOUNTER — Encounter: Payer: Self-pay | Admitting: Family Medicine

## 2020-11-14 ENCOUNTER — Encounter: Payer: Self-pay | Admitting: Family Medicine

## 2020-11-18 ENCOUNTER — Telehealth: Payer: Self-pay

## 2020-11-18 ENCOUNTER — Encounter: Payer: Self-pay | Admitting: Family Medicine

## 2020-11-18 ENCOUNTER — Other Ambulatory Visit: Payer: Self-pay | Admitting: Gastroenterology

## 2020-11-18 DIAGNOSIS — K3184 Gastroparesis: Secondary | ICD-10-CM

## 2020-11-18 NOTE — Telephone Encounter (Signed)
Spoke to patient this morning to discuss recommendations from Dr Bryan Lemma and Dr Redmond Pulling regarding gastric emptying scan test for preoperative evaluation HH/TIF surgery. Patient is scheduled for GES 12/15/20 at [facility]. His results will be reviewed to determine if further testing is needed. Then wil start coordinating OR date for combined hiatal hernia repair/TIF. Patient voiced understanding. He will call the office with further questions.

## 2020-11-18 NOTE — Telephone Encounter (Signed)
-----   Message from [place], DO sent at 11/17/2020  3:18 PM EST ----- Regarding: FW: tif/HHT Can you please coordinate the following for this patient: -Gastric emptying study to rule out gastroparesis prior to hiatal hernia repair/fundoplication (due to history of diabetes) -Dr. Redmond Pulling will let me know if he wants Esophageal Manometry ordered for preoperative evaluation.  Otherwise will not need pH/impedance, so no need for PPI hold for that test (again, only if Redmond Pulling wants it done) -Can start coordinating OR date for combined hiatal hernia repair/TIF  Thank you.  ----- Message ----- From: Greer Pickerel, MD Sent: 11/17/2020  12:52 PM EST To: Lavena Bullion, DO Subject: tif/HHT                                        Hi Vito  Saw this guy this am. Seems like a good candidate for tif/HHR since majority of symptoms controlled with PPI except nighttime.   I disagree with UGI interpretation about no hiatal hernia.   I reviewed your EGD pics - def has one.   Any thoughts about no signs of esophagitis on your EGD?   Do you think there could be a component of gastroparesis given his DM2?  It seems like pt had concerns about temp stopping PPI for ph/manometry.  Interested to hear your thoughts.   Feel free to call my cell if easier  Thanks Randall Hiss

## 2020-11-30 ENCOUNTER — Ambulatory Visit: Payer: 59 | Admitting: Gastroenterology

## 2020-11-30 ENCOUNTER — Encounter: Payer: Self-pay | Admitting: Gastroenterology

## 2020-11-30 ENCOUNTER — Ambulatory Visit (INDEPENDENT_AMBULATORY_CARE_PROVIDER_SITE_OTHER): Payer: 59 | Admitting: Gastroenterology

## 2020-11-30 VITALS — BP 128/64 | HR 91 | Ht 73.0 in | Wt 214.2 lb

## 2020-11-30 DIAGNOSIS — K219 Gastro-esophageal reflux disease without esophagitis: Secondary | ICD-10-CM

## 2020-11-30 DIAGNOSIS — K449 Diaphragmatic hernia without obstruction or gangrene: Secondary | ICD-10-CM

## 2020-11-30 NOTE — Progress Notes (Signed)
P  Chief Complaint:    GERD, hiatal hernia, Discuss antireflux surgical options  GI History: 45 year old man with a history of depression, anxiety, HTN, HLD, follows in the GI clinic for reflux and hiatal hernia.  Has a longstanding history of reflux for the last 15-20 years, previously followed by GI in [place] prior to moving back to [place] in 03/2020. Index sxs of HB, regurgitation. Bothersome daytime sxs but also significant +nocturnal symptoms (choking, regurgitaiton, emesis).  Has even been hospitalized with aspiration PNA requiring intubation in 04/2020 presumed 2/2 nocturnal reflux.  Exacerbated by spicy foods, chocolate, coffee, supine, lying on right side.  Regurgitation with forward flexion.  Has trialed multiple medications in the past, to include Nexium, omeprazole, Tums, carafate.  Escalating doses over the years. Currently taking Protonix 40 mg bid which generally controls symptoms. Breakthrough with any missed doses.  Sleeps with HOB elevated, avoids eating within 3 hours of bedtime, and recently stopped evening caffeine intake.  Was recommended to consider antireflux surgery by his previous Gastroenterologist, but patient subsequently moved back to [place].  Labs on 05/12/2020 after hospital discharge from aspiration pneumonia notable for the following: -Normal CBC -Normal CMP  GERD-HRQL Questionnaire score: 35/50 (on PPI), and marked "dissatisfied" with current health condition   GERD work-up: -Reports having EGD years ago in [place]; cannot recall results - EGD (03/2020, [facility], Dr. Tora Kindred): Z-line irregular at 39 cm (path: Reflux changes without intestinal metaplasia), 1 cm HH, normal stomach and duodenum -EGD (09/2020, Dr. Bryan Lemma): 3 cm HH, Hill grade 4, fundic gland polyps -Esophagram (09/2020): Normal esophagus, normal motility.  Positive gastroesophageal reflux -GES: Scheduled for 12/15/2020   -Colonoscopy (09/2020, Dr. Bryan Lemma): 6 mm  polyp (path benign), internal hemorrhoids, normal TI.  Repeat 10 years  HPI:     Patient is a 45 y.o. male presenting to the Gastroenterology Clinic for follow-up.  Initially seen by me on 09/02/2020 for evaluation of GERD and specifically evaluation for antireflux surgical options.  Completed EGD in 09/2020 which was notable for 3 cm HH and Hill grade 4 valve.    Unfortunately, could not tolerate pH/impedance or EM probe, so was evaluated with esophagram which demonstrated positive gastroesophageal reflux with otherwise normal esophageal motility.   He had met with Dr. Redmond Pulling last month and plan for GES later this month, and if normal, plan for cTIF. He is o/w in his usual state of health without any acute issues today.  Still taking Protonix bid and Tums for breakthrough.   Review of systems:     No chest pain, no SOB, no fevers, no urinary sx   Past Medical History:  Diagnosis Date  . Allergy   . Anxiety    patient states history   . Depression    history   . Hyperlipidemia   . Hypertension     Patient's surgical history, family medical history, social history, medications and allergies were all reviewed in Epic    Current Outpatient Medications  Medication Sig Dispense Refill  . fenofibrate 160 MG tablet Take 1 tablet (160 mg total) by mouth daily. 90 tablet 3  . hydrochlorothiazide (MICROZIDE) 12.5 MG capsule Take 1 capsule (12.5 mg total) by mouth daily. 90 capsule 3  . losartan (COZAAR) 25 MG tablet Take 1 tablet (25 mg total) by mouth daily. 90 tablet 3  . metFORMIN (GLUCOPHAGE XR) 500 MG 24 hr tablet Take 1 tablet (500 mg total) by mouth daily with breakfast. 90 tablet 3  .  pantoprazole (PROTONIX) 40 MG tablet TAKE ONE TABLET BY MOUTH TWICE A DAY 60 tablet 1  . venlafaxine XR (EFFEXOR XR) 37.5 MG 24 hr capsule Take 1 capsule (37.5 mg total) by mouth daily with breakfast. (Patient taking differently: Take 37.5 mg by mouth as directed. Once every 3 days) 90 capsule 0   No  current facility-administered medications for this visit.    Physical Exam:     BP 128/64   Pulse 91   Ht '6\' 1"'  (1.854 m)   Wt 214 lb 4 oz (97.2 kg)   BMI 28.27 kg/m   GENERAL:  Pleasant male in NAD PSYCH: : Cooperative, normal affect NEURO: Alert and oriented x 3, no focal neurologic deficits   IMPRESSION and PLAN:    1) GERD 2) Hiatal hernia  -Has clear objective evidence of reflux demonstrated on recent esophagram.  He has already met with Dr. Redmond Pulling, with plan for combined laparoscopic hiatal hernia repair and TIF pending normal GES (history of diabetes).  Again discussed surgical option at length today, and he wishes to proceed with cTIF -Follow-up Gastric Emptying Study scheduled for later this month.   If normal, plan to proceed with cTIF -Continue Protonix as currently prescribed -Continue antireflux lifestyle/dietary modifications -We reviewed the postoperative dietary and exercise/activity limitations  3) History of diabetes -As above, planning preoperative GES -Otherwise continue current diabetes treatment.  Recent A1c downtrending -Will ask for admission to Hospitalist service for overnight observation  I spent 30 minutes of time, including in depth chart review, independent review of results as outlined above, communicating results with the patient directly, face-to-face time with the patient, coordinating care, and ordering studies and medications as appropriate, and documentation.   [place] ,DO, FACG 11/30/2020, 11:14 AM

## 2020-11-30 NOTE — Patient Instructions (Signed)
If you are age 46 or older, your body mass index should be between 23-30. Your Body mass index is 28.27 kg/m. If this is out of the aforementioned range listed, please consider follow up with your Primary Care Provider.  If you are age 104 or younger, your body mass index should be between 19-25. Your Body mass index is 28.27 kg/m. If this is out of the aformentioned range listed, please consider follow up with your Primary Care Provider.   You are scheduled for a Gastric emptying study on 12/15/2020 at [facility]. We will contact you after this study with the results and a tentative time for the surgery. We will also seek prior authorization for this procedure.  Due to recent changes in healthcare laws, you may see the results of your imaging and laboratory studies on MyChart before your provider has had a chance to review them.  We understand that in some cases there may be results that are confusing or concerning to you. Not all laboratory results come back in the same time frame and the provider may be waiting for multiple results in order to interpret others.  Please give [place] 48 hours in order for your provider to thoroughly review all the results before contacting the office for clarification of your results.   Thank you for choosing me and [facility].  Vito Cirigliano, D.O.

## 2020-12-02 ENCOUNTER — Telehealth: Payer: Self-pay | Admitting: Gastroenterology

## 2020-12-02 NOTE — Telephone Encounter (Signed)
Abby from [facility] is requesting a call back to coordinate a case for the pt's hiatal hernia repair.  [phone]

## 2020-12-03 NOTE — Telephone Encounter (Signed)
Spoke to Abby at [employer]. She will contact the patient to coordinate HH/TIF for 02/17/21 11:30 am at [facility]

## 2020-12-05 ENCOUNTER — Other Ambulatory Visit: Payer: Self-pay | Admitting: Family Medicine

## 2020-12-05 DIAGNOSIS — K219 Gastro-esophageal reflux disease without esophagitis: Secondary | ICD-10-CM

## 2020-12-06 NOTE — Telephone Encounter (Signed)
Last OV 10/14/20 Last fill 10/07/20  #60/1

## 2020-12-15 ENCOUNTER — Ambulatory Visit (HOSPITAL_COMMUNITY): Admission: RE | Admit: 2020-12-15 | Payer: 59 | Source: Ambulatory Visit

## 2020-12-16 ENCOUNTER — Encounter: Payer: Self-pay | Admitting: Family Medicine

## 2020-12-16 ENCOUNTER — Telehealth (INDEPENDENT_AMBULATORY_CARE_PROVIDER_SITE_OTHER): Payer: 59 | Admitting: Family Medicine

## 2020-12-16 DIAGNOSIS — F17290 Nicotine dependence, other tobacco product, uncomplicated: Secondary | ICD-10-CM | POA: Diagnosis not present

## 2020-12-16 DIAGNOSIS — F418 Other specified anxiety disorders: Secondary | ICD-10-CM

## 2020-12-16 MED ORDER — VENLAFAXINE HCL ER 37.5 MG PO CP24: 37.5000 mg | ORAL_CAPSULE | ORAL | 0 refills | Status: DC

## 2020-12-16 MED ORDER — VARENICLINE TARTRATE 1 MG PO TABS: ORAL_TABLET | ORAL | 1 refills | Status: DC

## 2020-12-16 NOTE — Progress Notes (Signed)
Virtual Visit via Video Note  I connected with Kyle [name] on 12/16/20 at 11:00 AM EDT by a video enabled telemedicine application and verified that I am speaking with the correct person using two identifiers. Location patient: home Location provider: work  Persons participating in the virtual visit: patient, provider  I discussed the limitations of evaluation and management by telemedicine and the availability of in person appointments. The patient expressed understanding and agreed to proceed.  Chief Complaint  Patient presents with  . Nicotine Dependence      Pt would like to discuss Chantix     HPI: Kyle [name] is a 45 y.o. male who currently vaping every 2-3 hours during the work day. Pt also does this in the middle of the night once and first thing when he wakes up. Pt has been vaping about 5 years and prior to that was smoking cigarettes since high school. He was able to quit using chantix for 5-6 years. Pt has AM nausea and weird/vivid dreams on med but felt this was tolerable.   He is also having trouble weaning off effexor. He is taking 37.5mg  every 3-4 days but if he waits longer to take it he experiences side effects/withdrawl symptoms. He has been very slowly tapering down from 225mg  daily.   Past Medical History:  Diagnosis Date  . Allergy   . Anxiety    patient states history   . Depression    history   . Hyperlipidemia   . Hypertension     Past Surgical History:  Procedure Laterality Date  . ESOPHAGOGASTRODUODENOSCOPY     2021 [facility]  . left leg surgery    . SINUSOTOMY    . TONSILLECTOMY AND ADENOIDECTOMY      Family History  Problem Relation Age of Onset  . Healthy Mother   . Hypertension Father   . Hyperlipidemia Father   . Prostate cancer Father   . Bladder Cancer Father   . Birth defects Maternal Grandfather   . Bone cancer Maternal Grandfather   . Birth defects Paternal Grandfather   . Cancer Maternal Uncle      Social History   Tobacco Use  . Smoking status: Former Research scientist (life sciences)  . Smokeless tobacco: Former Network engineer  . Vaping Use: Every day  Substance Use Topics  . Alcohol use: Not Currently    Alcohol/week: 4.0 standard drinks    Types: 1 Glasses of wine, 1 Cans of beer, 1 Shots of liquor, 1 Standard drinks or equivalent per week    Comment: no alcohol in 8-55months  . Drug use: Not Currently    Types: Marijuana     Current Outpatient Medications:  .  fenofibrate 160 MG tablet, Take 1 tablet (160 mg total) by mouth daily., Disp: 90 tablet, Rfl: 3 .  hydrochlorothiazide (MICROZIDE) 12.5 MG capsule, Take 1 capsule (12.5 mg total) by mouth daily., Disp: 90 capsule, Rfl: 3 .  metFORMIN (GLUCOPHAGE XR) 500 MG 24 hr tablet, Take 1 tablet (500 mg total) by mouth daily with breakfast., Disp: 90 tablet, Rfl: 3 .  pantoprazole (PROTONIX) 40 MG tablet, TAKE ONE TABLET BY MOUTH TWICE A DAY, Disp: 60 tablet, Rfl: 1 .  venlafaxine XR (EFFEXOR XR) 37.5 MG 24 hr capsule, Take 1 capsule (37.5 mg total) by mouth daily with breakfast. (Patient taking differently: Take 37.5 mg by mouth as directed. Once every 3 days), Disp: 90 capsule, Rfl: 0 .  losartan (COZAAR) 25 MG tablet, Take 1 tablet (  25 mg total) by mouth daily., Disp: 90 tablet, Rfl: 3  Allergies  Allergen Reactions  . Benzonatate Other (See Comments)      ROS: See pertinent positives and negatives per HPI.   EXAM:  VITALS per patient if applicable: There were no vitals taken for this visit.  Wt Readings from Last 3 Encounters:  11/30/20 214 lb 4 oz (97.2 kg)  10/14/20 224 lb 6.4 oz (101.8 kg)  09/30/20 233 lb (105.7 kg)   Temp Readings from Last 3 Encounters:  10/14/20 (!) 97 F (36.1 C) (Temporal)  09/30/20 (!) 96.6 F (35.9 C)  09/10/20 97.8 F (36.6 C)   BP Readings from Last 3 Encounters:  11/30/20 128/64  10/14/20 120/72  09/30/20 100/70   Pulse Readings from Last 3 Encounters:  11/30/20 91  10/14/20 74   09/30/20 62     GENERAL: alert, oriented, appears well and in no acute distress  HEENT: atraumatic, conjunctiva clear, no obvious abnormalities on inspection of external nose and ears  NECK: normal movements of the head and neck  LUNGS: on inspection no signs of respiratory distress, breathing rate appears normal, no obvious gross SOB, gasping or wheezing, no conversational dyspnea  CV: no obvious cyanosis  MS: moves all visible extremities without noticeable abnormality  PSYCH/NEURO: pleasant and cooperative, no obvious depression or anxiety, speech and thought processing grossly intact   ASSESSMENT AND PLAN: 1. Other tobacco product nicotine dependence, uncomplicated - pt vapes every 2-3 hours, including waking up overnight - he successfully quit smoking cigarettes for 5-6 years using chantix, which he tolerated with minimal side effects Rx: - varenicline (CHANTIX CONTINUING MONTH PAK) 1 MG tablet; 1/2 tab (0.5mg ) po daily x 3 days, then 1/2 tab (0.5mg ) po BID x 4 days then 1 tab po BID  Dispense: 180 tablet; Refill: 1  2. Depression with anxiety - pt is having trouble stopping med even w/ very slow taper. He will try to take every 5th day for a few weeks and then every 6th day. If this is not helpful, he plans to just stop med and deal with withdrawal symptoms x 3-4 days Refill: - venlafaxine XR (EFFEXOR XR) 37.5 MG 24 hr capsule; Take 1 capsule (37.5 mg total) by mouth as directed. Once every 3 days  Dispense: 30 capsule; Refill: 0    I discussed the assessment and treatment plan with the patient. The patient was provided an opportunity to ask questions and all were answered. The patient agreed with the plan and demonstrated an understanding of the instructions.   The patient was advised to call back or seek an in-person evaluation if the symptoms worsen or if the condition fails to improve as anticipated.   Letta Median, DO

## 2020-12-16 NOTE — Patient Instructions (Signed)
Health Maintenance Due  Topic Date Due  . Hepatitis C Screening  Never done  . FOOT EXAM  Never done  . OPHTHALMOLOGY EXAM  Never done  . URINE MICROALBUMIN  Never done  . INFLUENZA VACCINE  Never done    Depression screen [facility] 2/9 08/06/2020 07/28/2020 05/10/2020  Decreased Interest 0 1 0  Down, Depressed, Hopeless 0 1 0  PHQ - 2 Score 0 2 0  Altered sleeping - 3 3  Tired, decreased energy - 2 0  Change in appetite - 1 1  Feeling bad or failure about yourself  - 1 1  Trouble concentrating - 0 0  Moving slowly or fidgety/restless - 0 0  Suicidal thoughts - 0 0  PHQ-9 Score - 9 5  Difficult doing work/chores - Somewhat difficult Somewhat difficult

## 2020-12-29 NOTE — Telephone Encounter (Signed)
Thanks for the update. Will we keep him on schedule for 02/17/21 in anticipation of approval?

## 2020-12-29 NOTE — Telephone Encounter (Signed)
Spoke with Abby at [place], she states that patient has switched jobs and will not have active insurance until 01/23/21. She states at that time they will work on pre-authorization for the [facility] repair and will call us to coordinate.

## 2020-12-31 NOTE — Telephone Encounter (Signed)
Lm on vm for Abby at [facility] to discuss if we will be keeping patient on for 02/17/21 with anticipation of insurance approval.

## 2021-01-19 NOTE — Telephone Encounter (Signed)
Spoke with Caryl Pina at [employer], she is pulling patient's chart to review. Will let me know about insurance response.

## 2021-01-27 ENCOUNTER — Other Ambulatory Visit: Payer: Self-pay

## 2021-01-27 DIAGNOSIS — K219 Gastro-esophageal reflux disease without esophagitis: Secondary | ICD-10-CM

## 2021-01-27 DIAGNOSIS — K449 Diaphragmatic hernia without obstruction or gangrene: Secondary | ICD-10-CM

## 2021-01-27 NOTE — Telephone Encounter (Signed)
Kyle [name] with [facility] spoke with patient and 04/07/21 will work for him. Patient has been scheduled for a cTIF on 04/07/21. Dr. Redmond Pulling will begin at 7:30 AM and Dr. Bryan Lemma to follow at 8:30 AM. Spoke with patient, he has been scheduled for his 2 week post-TIF follow up on Wednesday, 04/20/21 at 9:40 AM. Patient is aware that I will be mailing him a copy of his post TIF diet and instructions, I will also send that information to My Chart. Patient verbalized understanding and had no concerns at the end of the call.

## 2021-02-01 ENCOUNTER — Other Ambulatory Visit: Payer: Self-pay | Admitting: Family Medicine

## 2021-02-01 DIAGNOSIS — K219 Gastro-esophageal reflux disease without esophagitis: Secondary | ICD-10-CM

## 2021-02-16 ENCOUNTER — Other Ambulatory Visit: Payer: Self-pay

## 2021-02-16 ENCOUNTER — Ambulatory Visit (HOSPITAL_COMMUNITY)
Admission: RE | Admit: 2021-02-16 | Discharge: 2021-02-16 | Disposition: A | Discharge status: Home / Self Care | Payer: BC Managed Care – PPO | Source: Ambulatory Visit | Attending: Gastroenterology | Admitting: Gastroenterology

## 2021-02-16 DIAGNOSIS — K3184 Gastroparesis: Secondary | ICD-10-CM | POA: Diagnosis not present

## 2021-02-16 MED ORDER — TECHNETIUM TC 99M SULFUR COLLOID
2.2000 | Freq: Once | INTRAVENOUS | Status: AC | PRN
  Administered 2021-02-16: 2.2 via INTRAVENOUS

## 2021-03-02 ENCOUNTER — Telehealth: Payer: BC Managed Care – PPO | Admitting: Physician Assistant

## 2021-03-02 ENCOUNTER — Encounter: Payer: Self-pay | Admitting: Physician Assistant

## 2021-03-02 ENCOUNTER — Other Ambulatory Visit: Payer: Self-pay | Admitting: Family

## 2021-03-02 ENCOUNTER — Encounter: Payer: Self-pay | Admitting: Family Medicine

## 2021-03-02 DIAGNOSIS — B9689 Other specified bacterial agents as the cause of diseases classified elsewhere: Secondary | ICD-10-CM | POA: Diagnosis not present

## 2021-03-02 DIAGNOSIS — J028 Acute pharyngitis due to other specified organisms: Secondary | ICD-10-CM | POA: Diagnosis not present

## 2021-03-02 MED ORDER — AMOXICILLIN-POT CLAVULANATE 875-125 MG PO TABS: 1.0000 | ORAL_TABLET | Freq: Two times a day (BID) | ORAL | 0 refills | Status: DC

## 2021-03-02 NOTE — Progress Notes (Signed)
Mr. Kyle [name], cada are scheduled for a virtual visit with your provider today.    Just as we do with appointments in the office, we must obtain your consent to participate.  Your consent will be active for this visit and any virtual visit you may have with one of our providers in the next 365 days.    If you have a MyChart account, I can also send a copy of this consent to you electronically.  All virtual visits are billed to your insurance company just like a traditional visit in the office.  As this is a virtual visit, video technology does not allow for your provider to perform a traditional examination.  This may limit your provider's ability to fully assess your condition.  If your provider identifies any concerns that need to be evaluated in person or the need to arrange testing such as labs, EKG, etc, we will make arrangements to do so.    Although advances in technology are sophisticated, we cannot ensure that it will always work on either your end or our end.  If the connection with a video visit is poor, we may have to switch to a telephone visit.  With either a video or telephone visit, we are not always able to ensure that we have a secure connection.   I need to obtain your verbal consent now.   Are you willing to proceed with your visit today?   Kyle [name] has provided verbal consent on 03/02/2021 for a virtual visit (video or telephone).  Kyle Rio, PA-C 03/02/2021  7:25 AM  Virtual Visit via Video   I connected with patient on 03/02/21 at  7:30 AM EDT by a video enabled telemedicine application and verified that I am speaking with the correct person using two identifiers.  Location patient: Home Location provider: Modest [name] participating in the virtual visit: Patient, Provider  I discussed the limitations of evaluation and management by telemedicine and the availability of in person appointments. The patient expressed understanding and agreed to  proceed.  Subjective:   HPI:  Patient presents via [facility] today complaining of few days of progressively worsening bilateral sore throat with swollen tonsils and neck tenderness.  Notes tonsils are very red but cannot tell if there is any patches on them.  Initially was some very mild rhinorrhea and dry cough.  Denies any chest congestion, sinus pressure, sinus pain, ear pain or tooth pain.  Denies body aches or chills.  Unsure of fever.  Did take a COVID test just to be cautious and this was negative.  Denies any recent travel or known sick contact.  ROS:   See pertinent positives and negatives per HPI.  Patient Active Problem List   Diagnosis Date Noted  . Hypertriglyceridemia 05/10/2020  . Type 2 diabetes mellitus with hyperglycemia, without long-term current use of insulin ([place]) 05/10/2020  . Recovering alcoholic (Kyle [name]) [birth year]  . Depression with anxiety 05/10/2020  . Gastroesophageal reflux disease 05/10/2020  . Sepsis ([place]) 05/01/2020  . Essential hypertension 05/01/2020  . Multifocal pneumonia   . Acute respiratory failure with hypoxia ([place]) 04/30/2020    Social History   Tobacco Use  . Smoking status: Former Research scientist (life sciences)  . Smokeless tobacco: Former Network engineer Use Topics  . Alcohol use: Not Currently    Alcohol/week: 4.0 standard drinks    Types: 1 Glasses of wine, 1 Cans of beer, 1 Shots of liquor, 1 Standard drinks or equivalent per week  Comment: no alcohol in 8-26months    Current Outpatient Medications:  .  fenofibrate 160 MG tablet, Take 1 tablet (160 mg total) by mouth daily., Disp: 90 tablet, Rfl: 3 .  hydrochlorothiazide (MICROZIDE) 12.5 MG capsule, Take 1 capsule (12.5 mg total) by mouth daily., Disp: 90 capsule, Rfl: 3 .  losartan (COZAAR) 25 MG tablet, Take 1 tablet (25 mg total) by mouth daily., Disp: 90 tablet, Rfl: 3 .  metFORMIN (GLUCOPHAGE XR) 500 MG 24 hr tablet, Take 1 tablet (500 mg total) by mouth daily with breakfast., Disp: 90 tablet, Rfl:  3 .  pantoprazole (PROTONIX) 40 MG tablet, TAKE ONE TABLET BY MOUTH TWICE A DAY, Disp: 60 tablet, Rfl: 2 .  varenicline (CHANTIX CONTINUING MONTH PAK) 1 MG tablet, 1/2 tab (0.5mg ) po daily x 3 days, then 1/2 tab (0.5mg ) po BID x 4 days then 1 tab po BID, Disp: 180 tablet, Rfl: 1 .  venlafaxine XR (EFFEXOR XR) 37.5 MG 24 hr capsule, Take 1 capsule (37.5 mg total) by mouth as directed. Once every 3 days, Disp: 30 capsule, Rfl: 0  Allergies  Allergen Reactions  . Benzonatate Other (See Comments)    Objective:   There were no vitals taken for this visit.  Patient is well-developed, well-nourished in no acute distress.  Resting comfortably at home.  Head is normocephalic, atraumatic.  No labored breathing.  Speech is clear and coherent with logical content.  Patient is alert and oriented at baseline.   Assessment and Plan:   1. Bacterial pharyngitis Giving severity of sore throat, neck tenderness and negative COVID test will treat for suspected bacterial pharyngitis.  Rx Augmentin twice daily x7 days.  Increase fluids and rest.  Alternate Tylenol and ibuprofen.  Start saline nasal rinse.  Mucinex DM for cough if needed.  Follow-up with PCP if symptoms or not resolving. Kyle Rio, PA-C 03/02/2021

## 2021-03-02 NOTE — Patient Instructions (Signed)
Instructions sent to patients MyChart.

## 2021-03-03 MED ORDER — HYDROCOD POLST-CPM POLST ER 10-8 MG/5ML PO SUER
5.0000 mL | Freq: Two times a day (BID) | ORAL | 0 refills | Status: DC | PRN
Start: 2021-03-03 — End: 2021-04-08

## 2021-03-14 ENCOUNTER — Encounter: Payer: Self-pay | Admitting: Family Medicine

## 2021-03-14 DIAGNOSIS — J3081 Allergic rhinitis due to animal (cat) (dog) hair and dander: Secondary | ICD-10-CM

## 2021-03-14 MED ORDER — LEVOCETIRIZINE DIHYDROCHLORIDE 5 MG PO TABS
5.0000 mg | ORAL_TABLET | Freq: Every evening | ORAL | 1 refills | Status: DC
Start: 2021-03-14 — End: 2021-09-12

## 2021-03-14 MED ORDER — FLUTICASONE PROPIONATE 50 MCG/ACT NA SUSP: 2.0000 | Freq: Every day | NASAL | 11 refills | Status: DC

## 2021-03-14 NOTE — Telephone Encounter (Signed)
Please see message and advise.  Thank you. ° °

## 2021-03-14 NOTE — Addendum Note (Signed)
Addended by: Leana Gamer on: 03/14/2021 04:32 PM   Modules accepted: Orders

## 2021-03-29 DIAGNOSIS — J301 Allergic rhinitis due to pollen: Secondary | ICD-10-CM | POA: Diagnosis not present

## 2021-03-29 DIAGNOSIS — J3089 Other allergic rhinitis: Secondary | ICD-10-CM | POA: Diagnosis not present

## 2021-03-29 DIAGNOSIS — J3081 Allergic rhinitis due to animal (cat) (dog) hair and dander: Secondary | ICD-10-CM | POA: Diagnosis not present

## 2021-03-31 ENCOUNTER — Ambulatory Visit: Payer: Self-pay | Admitting: General Surgery

## 2021-03-31 NOTE — Progress Notes (Signed)
Pt. Needs orders for upcoming surgery.PAT and labs on: 04/01/21.

## 2021-03-31 NOTE — Patient Instructions (Addendum)
DUE TO COVID-19 ONLY ONE VISITOR IS ALLOWED TO COME WITH YOU AND STAY IN THE WAITING ROOM ONLY DURING PRE OP AND PROCEDURE DAY OF SURGERY. THE 1 VISITOR  MAY VISIT WITH YOU AFTER SURGERY IN YOUR PRIVATE ROOM DURING VISITING HOURS ONLY!  YOU NEED TO HAVE A COVID 19 TEST ON: 04/04/21 @ 8:30 AM, THIS TEST MUST BE DONE BEFORE SURGERY,  COVID TESTING SITE [place] [id number], IT IS ON THE RIGHT [address]ING OUT [address] APPROXIMATELY  2 MINUTES PAST [employer] ON THE RIGHT. ONCE YOUR COVID TEST IS COMPLETED,  PLEASE BEGIN THE QUARANTINE INSTRUCTIONS AS OUTLINED IN YOUR HANDOUT.                Kyle [name]   Your procedure is scheduled on: 04/07/21   Report to [facility] Main  Entrance   Report to short stay at : 5:15 AM     Call this number if you have problems the morning of surgery [phone]    Remember: NO SOLID FOOD AFTER MIDNIGHT THE NIGHT PRIOR TO SURGERY. NOTHING BY MOUTH EXCEPT CLEAR LIQUIDS UNTIL : 4:30 AM. PLEASE FINISH ENSURE DRINK PER SURGEON ORDER  WHICH NEEDS TO BE COMPLETED AT : 4:30 AM. CLEAR LIQUID DIET  Foods Allowed                                                                     Foods Excluded  Coffee and tea, regular and decaf                             liquids that you cannot  Plain Jell-O any favor except red or purple                                           see through such as: Fruit ices (not with fruit pulp)                                     milk, soups, orange juice  Iced Popsicles                                    All solid food Carbonated beverages, regular and diet                                    Cranberry, grape and apple juices Sports drinks like Gatorade Lightly seasoned clear broth or consume(fat free) Sugar, honey syrup  Sample Menu Breakfast                                Lunch  Supper Cranberry juice                    Beef broth                             Chicken broth Jell-O                                     Grape juice                           Apple juice Coffee or tea                        Jell-O                                      Popsicle                                                Coffee or tea                        Coffee or tea  _____________________________________________________________________    BRUSH YOUR TEETH MORNING OF SURGERY AND RINSE YOUR MOUTH OUT, NO CHEWING GUM CANDY OR MINTS.    Take these medicines the morning of surgery with A SIP OF WATER: pantoprazole.  How to Manage Your Diabetes Before and After Surgery  Why is it important to control my blood sugar before and after surgery? Improving blood sugar levels before and after surgery helps healing and can limit problems. A way of improving blood sugar control is eating a healthy diet by:  Eating less sugar and carbohydrates  Increasing activity/exercise  Talking with your doctor about reaching your blood sugar goals High blood sugars (greater than 180 mg/dL) can raise your risk of infections and slow your recovery, so you will need to focus on controlling your diabetes during the weeks before surgery. Make sure that the doctor who takes care of your diabetes knows about your planned surgery including the date and location.  How do I manage my blood sugar before surgery? Check your blood sugar at least 4 times a day, starting 2 days before surgery, to make sure that the level is not too high or low. Check your blood sugar the morning of your surgery when you wake up and every 2 hours until you get to the Short Stay unit. If your blood sugar is less than 70 mg/dL, you will need to treat for low blood sugar: Do not take insulin. Treat a low blood sugar (less than 70 mg/dL) with  cup of clear juice (cranberry or apple), 4 glucose tablets, OR glucose gel. Recheck blood sugar in 15 minutes after treatment (to make sure it is greater than 70 mg/dL). If your  blood sugar is not greater than 70 mg/dL on recheck, call [phone] for further instructions. Report your blood sugar to the short stay nurse when you get to Short Stay.  If you are admitted to the hospital after surgery: Your blood sugar will be checked by the  staff and you will probably be given insulin after surgery (instead of oral diabetes medicines) to make sure you have good blood sugar levels. The goal for blood sugar control after surgery is 80-180 mg/dL.   WHAT DO I DO ABOUT MY DIABETES MEDICATION?  Do not take oral diabetes medicines (pills) the morning of surgery.  THE DAY BEFORE SURGERY, take Metformin as usual.       THE MORNING OF SURGERY, DO NOT TAKE ANY ORAL DIABETIC MEDICATIONS DAY OF YOUR SURGERY                               You may not have any metal on your body including hair pins and              piercings  Do not wear jewelry, lotions, powders or perfumes, deodorant              Men may shave face and neck.   Do not bring valuables to the hospital. [place].  Contacts, dentures or bridgework may not be worn into surgery.  Leave suitcase in the car. After surgery it may be brought to your room.     Patients discharged the day of surgery will not be allowed to drive home. IF YOU ARE HAVING SURGERY AND GOING HOME THE SAME DAY, YOU MUST HAVE AN ADULT TO DRIVE YOU HOME AND BE WITH YOU FOR 24 HOURS. YOU MAY GO HOME BY TAXI OR UBER OR ORTHERWISE, BUT AN ADULT MUST ACCOMPANY YOU HOME AND STAY WITH YOU FOR 24 HOURS.  Name and phone number of your driver:  Special Instructions: N/A              Please read over the following fact sheets you were given: _____________________________________________________________________           [facility] - Preparing for Surgery Before surgery, you can play an important role.  Because skin is not sterile, your skin needs to be as free of germs as possible.  You can reduce the  number of germs on your skin by washing with CHG (chlorahexidine gluconate) soap before surgery.  CHG is an antiseptic cleaner which kills germs and bonds with the skin to continue killing germs even after washing. Please DO NOT use if you have an allergy to CHG or antibacterial soaps.  If your skin becomes reddened/irritated stop using the CHG and inform your nurse when you arrive at Short Stay. Do not shave (including legs and underarms) for at least 48 hours prior to the first CHG shower.  You may shave your face/neck. Please follow these instructions carefully:  1.  Shower with CHG Soap the night before surgery and the  morning of Surgery.  2.  If you choose to wash your hair, wash your hair first as usual with your  normal  shampoo.  3.  After you shampoo, rinse your hair and body thoroughly to remove the  shampoo.                           4.  Use CHG as you would any other liquid soap.  You can apply chg directly  to the skin and wash  Gently with a scrungie or clean washcloth.  5.  Apply the CHG Soap to your body ONLY FROM THE NECK DOWN.   Do not use on face/ open                           Wound or open sores. Avoid contact with eyes, ears mouth and genitals (private parts).                       Wash face,  Genitals (private parts) with your normal soap.             6.  Wash thoroughly, paying special attention to the area where your surgery  will be performed.  7.  Thoroughly rinse your body with warm water from the neck down.  8.  DO NOT shower/wash with your normal soap after using and rinsing off  the CHG Soap.                9.  Pat yourself dry with a clean towel.            10.  Wear clean pajamas.            11.  Place clean sheets on your bed the night of your first shower and do not  sleep with pets. Day of Surgery : Do not apply any lotions/deodorants the morning of surgery.  Please wear clean clothes to the hospital/surgery center.  FAILURE TO FOLLOW  THESE INSTRUCTIONS MAY RESULT IN THE CANCELLATION OF YOUR SURGERY PATIENT SIGNATURE_________________________________  NURSE SIGNATURE__________________________________  ________________________________________________________________________

## 2021-04-01 ENCOUNTER — Encounter (HOSPITAL_COMMUNITY): Payer: Self-pay

## 2021-04-01 ENCOUNTER — Encounter (HOSPITAL_COMMUNITY)
Admission: RE | Admit: 2021-04-01 | Discharge: 2021-04-01 | Disposition: A | Discharge status: Home / Self Care | Payer: BC Managed Care – PPO | Source: Ambulatory Visit | Attending: General Surgery | Admitting: General Surgery

## 2021-04-01 ENCOUNTER — Other Ambulatory Visit: Payer: Self-pay

## 2021-04-01 DIAGNOSIS — Z01812 Encounter for preprocedural laboratory examination: Secondary | ICD-10-CM | POA: Insufficient documentation

## 2021-04-01 HISTORY — DX: Malignant (primary) neoplasm, unspecified: C80.1

## 2021-04-01 HISTORY — DX: Pneumonia, unspecified organism: J18.9

## 2021-04-01 HISTORY — DX: Anemia, unspecified: D64.9

## 2021-04-01 HISTORY — DX: Type 2 diabetes mellitus without complications: E11.9

## 2021-04-01 HISTORY — DX: Abnormal levels of other serum enzymes: R74.8

## 2021-04-01 LAB — COMPREHENSIVE METABOLIC PANEL
ALT: 16 U/L (ref 0–44)
AST: 19 U/L (ref 15–41)
Albumin: 4.6 g/dL (ref 3.5–5.0)
Alkaline Phosphatase: 46 U/L (ref 38–126)
Anion gap: 9 (ref 5–15)
BUN: 27 mg/dL — ABNORMAL HIGH (ref 6–20)
CO2: 28 mmol/L (ref 22–32)
Calcium: 9.7 mg/dL (ref 8.9–10.3)
Chloride: 101 mmol/L (ref 98–111)
Creatinine, Ser: 0.87 mg/dL (ref 0.61–1.24)
GFR, Estimated: >60 mL/min (ref >60)
Glucose, Bld: 127 mg/dL — ABNORMAL HIGH (ref 70–99)
Potassium: 3.9 mmol/L (ref 3.5–5.1)
Sodium: 138 mmol/L (ref 135–145)
Total Bilirubin: 0.6 mg/dL (ref 0.3–1.2)
Total Protein: 7.9 g/dL (ref 6.5–8.1)

## 2021-04-01 LAB — CBC WITH DIFFERENTIAL/PLATELET
Abs Immature Granulocytes: 0.06 10*3/uL (ref 0.00–0.07)
Basophils Absolute: 0 10*3/uL (ref 0.0–0.1)
Basophils Relative: 0 %
Eosinophils Absolute: 0.1 10*3/uL (ref 0.0–0.5)
Eosinophils Relative: 2 %
HCT: 42.1 % (ref 39.0–52.0)
Hemoglobin: 14.1 g/dL (ref 13.0–17.0)
Immature Granulocytes: 1 %
Lymphocytes Relative: 20 %
Lymphs Abs: 1.7 10*3/uL (ref 0.7–4.0)
MCH: 29.1 pg (ref 26.0–34.0)
MCHC: 33.5 g/dL (ref 30.0–36.0)
MCV: 86.8 fL (ref 80.0–100.0)
Monocytes Absolute: 0.6 10*3/uL (ref 0.1–1.0)
Monocytes Relative: 7 %
Neutro Abs: 6 10*3/uL (ref 1.7–7.7)
Neutrophils Relative %: 70 %
Platelets: 239 10*3/uL (ref 150–400)
RBC: 4.85 MIL/uL (ref 4.22–5.81)
RDW: 13.2 % (ref 11.5–15.5)
WBC: 8.5 10*3/uL (ref 4.0–10.5)
nRBC: 0 % (ref 0.0–0.2)

## 2021-04-01 LAB — HEMOGLOBIN A1C
Hgb A1c MFr Bld: 6.4 % — ABNORMAL HIGH (ref 4.8–5.6)
Mean Plasma Glucose: 136.98 mg/dL

## 2021-04-01 LAB — GLUCOSE, CAPILLARY: Glucose-Capillary: 155 mg/dL — ABNORMAL HIGH (ref 70–99)

## 2021-04-01 NOTE — Progress Notes (Signed)
COVID Vaccine Completed:Yes Date COVID Vaccine completed:11/10/20 COVID vaccine manufacturer:     Moderna     PCP - Dr. Garvin Fila Cirigliano Cardiologist -   Chest x-ray - 04/30/20 EKG - 04/30/20 Stress Test -  ECHO -  Cardiac Cath -  Pacemaker/ICD device last checked:  Sleep Study -  CPAP -   Fasting Blood Sugar - N/A Checks Blood Sugar ___0__ times a day  Blood Thinner Instructions: Aspirin Instructions: Last Dose:  Anesthesia review: HTN,DIA  Patient denies shortness of breath, fever, cough and chest pain at PAT appointment   Patient verbalized understanding of instructions that were given to them at the PAT appointment. Patient was also instructed that they will need to review over the PAT instructions again at home before surgery.

## 2021-04-04 ENCOUNTER — Other Ambulatory Visit (HOSPITAL_COMMUNITY)
Admission: RE | Admit: 2021-04-04 | Discharge: 2021-04-04 | Disposition: A | Discharge status: Home / Self Care | Payer: BC Managed Care – PPO | Source: Ambulatory Visit | Attending: General Surgery | Admitting: General Surgery

## 2021-04-04 DIAGNOSIS — Z01812 Encounter for preprocedural laboratory examination: Secondary | ICD-10-CM | POA: Insufficient documentation

## 2021-04-04 DIAGNOSIS — Z20822 Contact with and (suspected) exposure to covid-19: Secondary | ICD-10-CM | POA: Diagnosis not present

## 2021-04-05 LAB — SARS CORONAVIRUS 2 (TAT 6-24 HRS): SARS Coronavirus 2: NEGATIVE

## 2021-04-06 ENCOUNTER — Ambulatory Visit: Payer: Self-pay | Admitting: General Surgery

## 2021-04-06 MED ORDER — BUPIVACAINE LIPOSOME 1.3 % IJ SUSP
20.0000 mL | Freq: Once | INTRAMUSCULAR | Status: DC
  Filled 2021-04-06: qty 20

## 2021-04-06 NOTE — H&P (Signed)
Kyle [name] Appointment: 11/17/2020 9:00 AM Location: [name] Patient #: [id number] DOB: 03/11/1976 Married / Language: Kyle [name] / Race: White Male   History of Present Illness Kyle [name] M. Kyle Puglia MD; 11/17/2020 12:22 PM) The patient is a 45 year old male who presents with gastroesophageal reflux disease. he is referred by Kyle Kyle [name] to discuss TIF and hiatal hernia repair.  He reports a long-standing history of reflux.  He states that he's been on medications for 15-20 years.  He describes his reflux as a burning in his chest.  He initially started with over-the-counter medications but every the years progressed to needing prescription medications with escalating doses.  He is been on Protonix for quite some time he is now on it twice a day.  If he takes it twice a day his symptoms are fairly controlled especially during the daytime however he has breakthrough symptoms at night.  He will feel a choking sensation and actually throw up liquid.  He rarely regurgitates food.  He feels that he aspirates at night.  He reports several hospitalizations due to aspiration pneumonia.  Most recent one was back in the fall of 2021.  He states that other gastroenterologists and surgeons have recommended reflux [name] for him.  If he does not take his Protonix first thing in the morning he will started to have epigastric burning by 10 AM.  He does not sleep elevated.  He sleeps on his left side.  If he sleeps on his right side he will definitely have more episodes of coughing and choking.  No pain with swallowing solids or liquids.  No globus sensation.  No early satiety.  He does vape with nicotine.  He has purposely been trying to lose weight.  He had an upper GI on January 14 which showed GERD but no evidence of hiatal hernia.  Upon my review at think there is evidence of a hiatal hernia.  He has hypertension, hyperlipidemia and diabetes type 2 all those A1c most recently was 6.3.   egd with Kyle [name] 10/11/19 (personally reviewed): - A 3 cm hiatal hernia was present. - The Z-line was regular and was found 37 cm from the incisors. - The gastroesophageal flap valve was visualized endoscopically and classified as Hill Grade IV (no fold, wide open lumen, hiatal hernia present). - A few small sessile polyps with no stigmata of recent bleeding were found in the gastric fundus and in the gastric body. A few of these polyps were removed with a cold biopsy forceps for histologic representative evaluation. Resection and retrieval were complete. Estimated blood loss was minimal. - The incisura, gastric antrum and pylorus were normal. - The examined duodenum was normal.  GERD-HRQL Questionnaire score: 35/50 (on PPI), and marked "dissatisfied" with current health condition   Endoscopic Hx: - EGD (03/2020, Kyle [name], Kyle. Tora [name]): Z-line irregular at 39 cm (path: Reflux changes without intestinal metaplasia), 1 cm HH, normal stomach and duodenum -Reports having EGD years ago in [place]; cannot recall results   Problem List/Past Medical Kyle [name] M. Kyle Pulling, MD; 11/17/2020 12:28 PM) HIATAL HERNIA WITH GASTROESOPHAGEAL REFLUX (K44.9, K21.9)   ENGAGES IN VAPING (Z72.89)    Past Surgical History Kyle Forehand, CNA; 11/17/2020 8:53 AM) Foot [name]   Left. Oral [name]   Tonsillectomy   Vasectomy    Allergies Kyle Forehand, CNA; 11/17/2020 8:54 AM) No Known Drug Allergies   [11/17/2020]:  Medication History Kyle Forehand, CNA; 11/17/2020 8:55 AM) Pantoprazole Sodium  (20MG  Tablet  Kyle, Oral) Active. Venlafaxine HCl  (25MG  Tablet, Oral) Active. Fenofibrate  (160MG  Tablet, Oral) Active. hydroCHLOROthiazide  (12.5MG  Capsule, Oral) Active. metFORMIN HCl  (500MG /5ML Solution, Oral) Active. Medications Reconciled   Social History Kyle Forehand, CNA; 11/17/2020 8:53 AM) Alcohol use   Remotely quit alcohol use. Caffeine use   Coffee. Illicit drug use   Remotely  quit drug use. Tobacco use   Former smoker.  Family History Kyle Forehand, CNA; 11/17/2020 8:53 AM) Cancer   Father. Hypertension   Father. Prostate Cancer   Father.  Other Problems Kyle [name] M. Kyle Pulling, MD; 11/17/2020 12:28 PM) Back Pain   Depression   Diabetes Mellitus   Gastric Ulcer   Gastroesophageal Reflux Disease   Hemorrhoids   High blood pressure   Hypercholesterolemia      Review of Systems Kyle Reams Alston CNA; 11/17/2020 8:53 AM) General Not Present- Appetite Loss, Chills, Fatigue, Fever, Night Sweats, Weight Gain and Weight Loss. Skin Not Present- Change in Wart/Mole, Dryness, Hives, Jaundice, New Lesions, Non-Healing Wounds, Rash and Ulcer. HEENT Not Present- Earache, Hearing Loss, Hoarseness, Nose Bleed, Oral Ulcers, Ringing in the Ears, Seasonal Allergies, Sinus Pain, Sore Throat, Visual Disturbances, Wears glasses/contact lenses and Yellow Eyes. Respiratory Not Present- Bloody sputum, Chronic Cough, Difficulty Breathing, Snoring and Wheezing. Breast Not Present- Breast Mass, Breast Pain, Nipple Discharge and Skin Changes. Cardiovascular Not Present- Chest Pain, Difficulty Breathing Lying Down, Leg Cramps, Palpitations, Rapid Heart Rate, Shortness of Breath and Swelling of Extremities. Gastrointestinal Present- Bloating, Excessive gas, Hemorrhoids, Indigestion and Vomiting. Not Present- Abdominal Pain, Bloody Stool, Change in Bowel Habits, Chronic diarrhea, Constipation, Difficulty Swallowing, Gets full quickly at meals, Nausea and Rectal Pain. Male Genitourinary Not Present- Blood in Urine, Change in Urinary Stream, Frequency, Impotence, Nocturia, Painful Urination, Urgency and Urine Leakage. Musculoskeletal Not Present- Back Pain, Joint Pain, Joint Stiffness, Muscle Pain, Muscle Weakness and Swelling of Extremities. Neurological Not Present- Decreased Memory, Fainting, Headaches, Numbness, Seizures, Tingling, Tremor, Trouble walking and Weakness. Psychiatric Not Present-  Anxiety, Bipolar, Change in Sleep Pattern, Depression, Fearful and Frequent crying. Endocrine Present- New Diabetes. Not Present- Cold Intolerance, Excessive Hunger, Hair Changes, Heat Intolerance and Hot flashes.  Vitals (Donyelle Alston CNA; 11/17/2020 8:55 AM) 11/17/2020 8:55 AM Weight: 217.38 lb   Height: 73 in  Body Surface Area: 2.23 m   Body Mass Index: 28.68 kg/m   Temp.: 97.2 F    Pulse: 86 (Regular)    P.OX: 96% (Room air) BP: 140/80(Sitting, Left Arm, Standard)       Physical Exam Kyle [name] M. Renate Danh MD; 11/17/2020 12:22 PM) General Mental Status - Alert. General Appearance - Consistent with stated age. Hydration - Well hydrated. Voice - Normal.  Head and Neck Head - normocephalic, atraumatic with no lesions or palpable masses. Trachea - midline. Thyroid Gland Characteristics - normal size and consistency.  Eye Eyeball - Bilateral - Extraocular movements intact. Sclera/Conjunctiva - Bilateral - No scleral icterus.  Chest and Lung Exam Chest and lung exam reveals  - quiet, even and easy respiratory effort with no use of accessory muscles and on auscultation, normal breath sounds, no adventitious sounds and normal vocal resonance. Inspection Chest Wall - Normal. Back - normal.  Breast - Did not examine.  Cardiovascular Cardiovascular examination reveals  - normal heart sounds, regular rate and rhythm with no murmurs and normal pedal pulses bilaterally.  Abdomen Inspection Inspection of the abdomen reveals - No Hernias. Skin - Scar - no surgical scars. Palpation/Percussion Palpation and Percussion of the abdomen reveal - Soft, Non  Tender, No Rebound tenderness, No Rigidity (guarding) and No hepatosplenomegaly. Auscultation Auscultation of the abdomen reveals - Bowel sounds normal.  Peripheral Vascular Upper Extremity Palpation - Pulses bilaterally normal.  Neurologic Neurologic evaluation reveals  - alert and oriented x 3 with no impairment of recent or  remote memory. Mental Status - Normal.  Neuropsychiatric The patient's mood and affect are described as  - normal. Judgment and Insight - insight is appropriate concerning matters relevant to self.  Musculoskeletal Normal Exam - Left - Upper Extremity Strength Normal and Lower Extremity Strength Normal. Normal Exam - Right - Upper Extremity Strength Normal and Lower Extremity Strength Normal.  Lymphatic Head & Neck  General Head & Neck Lymphatics: Bilateral - Description - Normal. Axillary - Did not examine. Femoral & Inguinal - Did not examine.    Assessment & Plan Kyle [name] M. Merrel Crabbe MD; 11/17/2020 12:28 PM) HIATAL HERNIA WITH GASTROESOPHAGEAL REFLUX (K44.9) Impression: The patient has gastroesophageal reflux disease with endoscopic evidence of a 3 cm hiatal hernia. Upon my review his upper GI I do appreciate a hiatal hernia. He was referred for concomitant hiatal hernia repair with TIF procedure by gastroenterology. We had a very long discussion about GERD and hiatal hernia. He was given an educational booklet. We reviewed his workup. There is no evidence of esophagitis on his EGD. but reportedly on a prior endoscopy he had biopsy-proven esophagitis.  He has fairly well controlled with Protonix during the daytime side do think he would benefit from hiatal hernia repair with transoral incisionless fundoplication. I discussed the steps of the procedure which would be obtaining access to the abdomen, reduction of the herniated stomach/hiatal hernia, closure of the diaphragm. We then briefly discussed the gastroenterology portion of the procedure. We discussed the overnight stay in the postoperative course. We discussed the diet and transition and progression after [name]. We discussed the typical issues that can occur after the [name] such as trouble swallowing, gas bloat, need for persistent heartburn medication, hiatal hernia recurrence. We discussed the other risk of the [name] including but  not limited to bleeding, infection, injury to surrounding structures such as the long or great vessels, blood clot formation, perioperative cardiac and pulmonary events, slipped wrap. We also discussed the classic surgical fundoplications. I will discuss his case with his gastroenterologist and see if we feel he needs any additional foregut evaluation prior to scheduling combined procedure. The patient like to move forward. All of his questions were asked and answered.  This patient encounter took 48 minutes today to perform the following: take history, perform exam, review outside records, interpret imaging, counsel the patient on their diagnosis and document encounter, findings & plan in the EHR Current Plans Pt Education - Pamphlet Given - Gastroesophagel Reflux Disease: discussed with patient and provided information. Pt Education - [facility] Free Text Education/Instructions ENGAGES IN VAPING [phone]) Impression: He does use nicotine. We discussed that nicotine as a vasoconstrictor and can impair wound healing. We discussed the importance of trying to stop this prior to any [name]  Leighton Ruff. Kyle Pulling, MD, FACS General, Bariatric, & Minimally Invasive [name], [place]

## 2021-04-06 NOTE — Anesthesia Preprocedure Evaluation (Addendum)
Anesthesia Evaluation  Patient identified by MRN, date of birth, ID band Patient awake    Reviewed: Allergy & Precautions, NPO status , Patient's Chart, lab work & pertinent test results  Airway Mallampati: II  TM Distance: >3 FB Neck ROM: Full    Dental no notable dental hx. (+) Teeth Intact, Dental Advisory Given   Pulmonary neg pulmonary ROS, former smoker,    Pulmonary exam normal breath sounds clear to auscultation       Cardiovascular hypertension, Pt. on medications Normal cardiovascular exam Rhythm:Regular Rate:Normal     Neuro/Psych negative neurological ROS     GI/Hepatic GERD  ,  Endo/Other  diabetes  Renal/GU Lab Results      Component                Value               Date                      CREATININE               0.87                04/01/2021                BUN                      27 (H)              04/01/2021                NA                       138                 04/01/2021                K                        3.9                 04/01/2021                CL                       101                 04/01/2021                CO2                      28                  04/01/2021                Musculoskeletal negative musculoskeletal ROS (+)   Abdominal   Peds  Hematology negative hematology ROS (+) Lab Results      Component                Value               Date                      WBC                      8.5  04/01/2021                HGB                      14.1                04/01/2021                HCT                      42.1                04/01/2021                MCV                      86.8                04/01/2021                PLT                      239                 04/01/2021              Anesthesia Other Findings   Reproductive/Obstetrics                            Anesthesia Physical Anesthesia Plan  ASA:  2  Anesthesia Plan: General   Post-op Pain Management:    Induction: Intravenous  PONV Risk Score and Plan: 3 and Midazolam, Dexamethasone, Ondansetron and Treatment may vary due to age or medical condition  Airway Management Planned: Oral ETT  Additional Equipment: None  Intra-op Plan:   Post-operative Plan: Extubation in OR  Informed Consent: I have reviewed the patients History and Physical, chart, labs and discussed the procedure including the risks, benefits and alternatives for the proposed anesthesia with the patient or authorized representative who has indicated his/her understanding and acceptance.     Dental advisory given  Plan Discussed with: CRNA and Anesthesiologist  Anesthesia Plan Comments: (GA w Lido infusion)       Anesthesia Quick Evaluation

## 2021-04-07 ENCOUNTER — Encounter (HOSPITAL_COMMUNITY)
Admission: RE | Disposition: A | Discharge status: Home / Self Care | Payer: Self-pay | Source: Ambulatory Visit | Attending: General Surgery

## 2021-04-07 ENCOUNTER — Other Ambulatory Visit: Payer: Self-pay

## 2021-04-07 ENCOUNTER — Encounter (HOSPITAL_COMMUNITY): Payer: Self-pay | Admitting: General Surgery

## 2021-04-07 ENCOUNTER — Observation Stay (HOSPITAL_COMMUNITY)
Admission: RE | Admit: 2021-04-07 | Discharge: 2021-04-08 | Disposition: A | Discharge status: Home / Self Care | Payer: BC Managed Care – PPO | Source: Ambulatory Visit | Attending: General Surgery | Admitting: General Surgery

## 2021-04-07 ENCOUNTER — Ambulatory Visit (HOSPITAL_COMMUNITY): Payer: BC Managed Care – PPO | Admitting: Anesthesiology

## 2021-04-07 DIAGNOSIS — Z87891 Personal history of nicotine dependence: Secondary | ICD-10-CM | POA: Insufficient documentation

## 2021-04-07 DIAGNOSIS — E119 Type 2 diabetes mellitus without complications: Secondary | ICD-10-CM | POA: Insufficient documentation

## 2021-04-07 DIAGNOSIS — K449 Diaphragmatic hernia without obstruction or gangrene: Secondary | ICD-10-CM | POA: Diagnosis not present

## 2021-04-07 DIAGNOSIS — I10 Essential (primary) hypertension: Secondary | ICD-10-CM | POA: Insufficient documentation

## 2021-04-07 DIAGNOSIS — Z859 Personal history of malignant neoplasm, unspecified: Secondary | ICD-10-CM | POA: Diagnosis not present

## 2021-04-07 DIAGNOSIS — K219 Gastro-esophageal reflux disease without esophagitis: Secondary | ICD-10-CM | POA: Diagnosis not present

## 2021-04-07 DIAGNOSIS — Z9889 Other specified postprocedural states: Secondary | ICD-10-CM

## 2021-04-07 DIAGNOSIS — Z79899 Other long term (current) drug therapy: Secondary | ICD-10-CM | POA: Diagnosis not present

## 2021-04-07 DIAGNOSIS — J9601 Acute respiratory failure with hypoxia: Secondary | ICD-10-CM | POA: Diagnosis not present

## 2021-04-07 HISTORY — PX: HIATAL HERNIA REPAIR: SHX195

## 2021-04-07 HISTORY — PX: UPPER GI ENDOSCOPY: SHX6162

## 2021-04-07 LAB — ABO/RH: ABO/RH(D): A NEG

## 2021-04-07 LAB — TYPE AND SCREEN
ABO/RH(D): A NEG
Antibody Screen: NEGATIVE

## 2021-04-07 LAB — GLUCOSE, CAPILLARY
Glucose-Capillary: 129 mg/dL — ABNORMAL HIGH (ref 70–99)
Glucose-Capillary: 144 mg/dL — ABNORMAL HIGH (ref 70–99)
Glucose-Capillary: 149 mg/dL — ABNORMAL HIGH (ref 70–99)
Glucose-Capillary: 165 mg/dL — ABNORMAL HIGH (ref 70–99)
Glucose-Capillary: 168 mg/dL — ABNORMAL HIGH (ref 70–99)

## 2021-04-07 SURGERY — REPAIR, HERNIA, HIATAL, LAPAROSCOPIC
Anesthesia: General | Site: Esophagus

## 2021-04-07 MED ORDER — KETOROLAC TROMETHAMINE 30 MG/ML IJ SOLN
30.0000 mg | Freq: Four times a day (QID) | INTRAMUSCULAR | Status: DC | PRN
  Administered 2021-04-07 – 2021-04-08 (×3): 30 mg via INTRAVENOUS
  Filled 2021-04-07 (×3): qty 1

## 2021-04-07 MED ORDER — SCOPOLAMINE 1 MG/3DAYS TD PT72
1.0000 | MEDICATED_PATCH | TRANSDERMAL | Status: DC
  Administered 2021-04-07: 1.5 mg via TRANSDERMAL

## 2021-04-07 MED ORDER — CHLORHEXIDINE GLUCONATE CLOTH 2 % EX PADS
6.0000 | MEDICATED_PAD | Freq: Once | CUTANEOUS | Status: DC
  Administered 2021-04-07: 6 via TOPICAL

## 2021-04-07 MED ORDER — SIMETHICONE 80 MG PO CHEW
40.0000 mg | CHEWABLE_TABLET | Freq: Four times a day (QID) | ORAL | Status: DC | PRN
  Administered 2021-04-07 – 2021-04-08 (×2): 40 mg via ORAL
  Filled 2021-04-07 (×2): qty 1

## 2021-04-07 MED ORDER — PHENYLEPHRINE 40 MCG/ML (10ML) SYRINGE FOR IV PUSH (FOR BLOOD PRESSURE SUPPORT)
PREFILLED_SYRINGE | INTRAVENOUS | Status: DC | PRN
  Administered 2021-04-07 (×2): 80 ug via INTRAVENOUS

## 2021-04-07 MED ORDER — ACETAMINOPHEN 10 MG/ML IV SOLN
INTRAVENOUS | Status: AC
  Filled 2021-04-07: qty 100

## 2021-04-07 MED ORDER — MORPHINE SULFATE (PF) 2 MG/ML IV SOLN
1.0000 mg | INTRAVENOUS | Status: DC | PRN
  Administered 2021-04-08: 2 mg via INTRAVENOUS
  Filled 2021-04-07: qty 1

## 2021-04-07 MED ORDER — PANTOPRAZOLE SODIUM 40 MG IV SOLR
40.0000 mg | Freq: Every day | INTRAVENOUS | Status: DC
  Administered 2021-04-07: 40 mg via INTRAVENOUS
  Filled 2021-04-07: qty 40

## 2021-04-07 MED ORDER — LIDOCAINE 20MG/ML (2%) 15 ML SYRINGE OPTIME
INTRAMUSCULAR | Status: DC | PRN
  Administered 2021-04-07: 1.5 mg/kg/h via INTRAVENOUS

## 2021-04-07 MED ORDER — OXYCODONE HCL 5 MG PO TABS: 5.0000 mg | ORAL_TABLET | Freq: Once | ORAL | Status: AC | PRN

## 2021-04-07 MED ORDER — HYDROMORPHONE HCL 1 MG/ML IJ SOLN
INTRAMUSCULAR | Status: AC
  Administered 2021-04-07: 0.5 mg via INTRAVENOUS
  Filled 2021-04-07: qty 1

## 2021-04-07 MED ORDER — ONDANSETRON HCL 4 MG/2ML IJ SOLN
INTRAMUSCULAR | Status: AC
  Filled 2021-04-07: qty 2

## 2021-04-07 MED ORDER — DIPHENHYDRAMINE HCL 50 MG/ML IJ SOLN: 12.5000 mg | Freq: Four times a day (QID) | INTRAMUSCULAR | Status: DC | PRN

## 2021-04-07 MED ORDER — LOSARTAN POTASSIUM 25 MG PO TABS
25.0000 mg | ORAL_TABLET | Freq: Every day | ORAL | Status: DC
  Administered 2021-04-08: 25 mg via ORAL
  Filled 2021-04-07: qty 1

## 2021-04-07 MED ORDER — METRONIDAZOLE 500 MG/100ML IV SOLN
500.0000 mg | Freq: Once | INTRAVENOUS | Status: AC
  Administered 2021-04-07: 500 mg via INTRAVENOUS
  Filled 2021-04-07: qty 100

## 2021-04-07 MED ORDER — ORAL CARE MOUTH RINSE: 15.0000 mL | Freq: Once | OROMUCOSAL | Status: AC

## 2021-04-07 MED ORDER — ROCURONIUM BROMIDE 10 MG/ML (PF) SYRINGE
PREFILLED_SYRINGE | INTRAVENOUS | Status: AC
  Filled 2021-04-07: qty 10

## 2021-04-07 MED ORDER — ONDANSETRON HCL 4 MG/2ML IJ SOLN
4.0000 mg | Freq: Once | INTRAMUSCULAR | Status: DC
  Administered 2021-04-07: 4 mg via INTRAVENOUS

## 2021-04-07 MED ORDER — SODIUM CHLORIDE 0.9 % IV SOLN
2.0000 g | Freq: Once | INTRAVENOUS | Status: AC
  Administered 2021-04-07: 2 g via INTRAVENOUS
  Filled 2021-04-07: qty 2

## 2021-04-07 MED ORDER — LIP MEDEX EX OINT
TOPICAL_OINTMENT | CUTANEOUS | Status: AC
  Filled 2021-04-07: qty 7

## 2021-04-07 MED ORDER — ENSURE PRE-SURGERY PO LIQD
296.0000 mL | Freq: Once | ORAL | Status: DC
  Filled 2021-04-07: qty 296

## 2021-04-07 MED ORDER — OXYCODONE HCL 5 MG PO TABS
ORAL_TABLET | ORAL | Status: AC
  Administered 2021-04-07: 5 mg via ORAL
  Filled 2021-04-07: qty 1

## 2021-04-07 MED ORDER — ACETAMINOPHEN 500 MG PO TABS
1000.0000 mg | ORAL_TABLET | ORAL | Status: AC
  Administered 2021-04-07: 1000 mg via ORAL
  Filled 2021-04-07: qty 2

## 2021-04-07 MED ORDER — SODIUM CHLORIDE 0.9 % IV SOLN: INTRAVENOUS | Status: DC

## 2021-04-07 MED ORDER — METOCLOPRAMIDE HCL 5 MG/ML IJ SOLN: 10.0000 mg | Freq: Three times a day (TID) | INTRAMUSCULAR | Status: DC | PRN

## 2021-04-07 MED ORDER — KETAMINE HCL 10 MG/ML IJ SOLN
INTRAMUSCULAR | Status: DC | PRN
  Administered 2021-04-07: 40 mg via INTRAVENOUS

## 2021-04-07 MED ORDER — SODIUM CHLORIDE 0.9 % IV SOLN: 2.0000 g | INTRAVENOUS | Status: DC

## 2021-04-07 MED ORDER — KETAMINE HCL 10 MG/ML IJ SOLN
INTRAMUSCULAR | Status: AC
  Filled 2021-04-07: qty 1

## 2021-04-07 MED ORDER — ACETAMINOPHEN 500 MG PO TABS
1000.0000 mg | ORAL_TABLET | Freq: Three times a day (TID) | ORAL | Status: DC
  Administered 2021-04-07 – 2021-04-08 (×3): 1000 mg via ORAL
  Filled 2021-04-07 (×2): qty 2

## 2021-04-07 MED ORDER — OXYCODONE HCL 5 MG/5ML PO SOLN
5.0000 mg | ORAL | Status: DC | PRN
  Administered 2021-04-07 – 2021-04-08 (×3): 5 mg via ORAL
  Filled 2021-04-07 (×4): qty 5

## 2021-04-07 MED ORDER — MIDAZOLAM HCL 2 MG/2ML IJ SOLN
INTRAMUSCULAR | Status: AC
  Filled 2021-04-07: qty 2

## 2021-04-07 MED ORDER — FAMOTIDINE IN NACL 20-0.9 MG/50ML-% IV SOLN
20.0000 mg | Freq: Once | INTRAVENOUS | Status: DC
  Filled 2021-04-07: qty 50

## 2021-04-07 MED ORDER — ROCURONIUM BROMIDE 10 MG/ML (PF) SYRINGE
PREFILLED_SYRINGE | INTRAVENOUS | Status: DC | PRN
  Administered 2021-04-07 (×2): 30 mg via INTRAVENOUS
  Administered 2021-04-07: 70 mg via INTRAVENOUS

## 2021-04-07 MED ORDER — DEXAMETHASONE SODIUM PHOSPHATE 10 MG/ML IJ SOLN
INTRAMUSCULAR | Status: AC
  Filled 2021-04-07: qty 1

## 2021-04-07 MED ORDER — FENTANYL CITRATE (PF) 100 MCG/2ML IJ SOLN
INTRAMUSCULAR | Status: AC
  Filled 2021-04-07: qty 2

## 2021-04-07 MED ORDER — MIDAZOLAM HCL 5 MG/5ML IJ SOLN
INTRAMUSCULAR | Status: DC | PRN
  Administered 2021-04-07: 2 mg via INTRAVENOUS

## 2021-04-07 MED ORDER — LIDOCAINE 2% (20 MG/ML) 5 ML SYRINGE
INTRAMUSCULAR | Status: DC | PRN
  Administered 2021-04-07: 100 mg via INTRAVENOUS

## 2021-04-07 MED ORDER — CHLORHEXIDINE GLUCONATE 0.12 % MT SOLN
15.0000 mL | Freq: Once | OROMUCOSAL | Status: AC
  Administered 2021-04-07: 15 mL via OROMUCOSAL

## 2021-04-07 MED ORDER — BUPIVACAINE LIPOSOME 1.3 % IJ SUSP
INTRAMUSCULAR | Status: DC | PRN
Start: 2021-04-07 — End: 2021-04-07
  Administered 2021-04-07: 20 mL

## 2021-04-07 MED ORDER — HYDROMORPHONE HCL 1 MG/ML IJ SOLN: 0.2500 mg | INTRAMUSCULAR | Status: DC | PRN

## 2021-04-07 MED ORDER — ONDANSETRON HCL 4 MG/2ML IJ SOLN
4.0000 mg | Freq: Four times a day (QID) | INTRAMUSCULAR | Status: DC
  Administered 2021-04-07 – 2021-04-08 (×3): 4 mg via INTRAVENOUS
  Filled 2021-04-07 (×3): qty 2

## 2021-04-07 MED ORDER — LACTATED RINGERS IR SOLN
Status: DC | PRN
  Administered 2021-04-07: 1000 mL

## 2021-04-07 MED ORDER — PHENYLEPHRINE 40 MCG/ML (10ML) SYRINGE FOR IV PUSH (FOR BLOOD PRESSURE SUPPORT)
PREFILLED_SYRINGE | INTRAVENOUS | Status: AC
  Filled 2021-04-07: qty 10

## 2021-04-07 MED ORDER — ENOXAPARIN SODIUM 40 MG/0.4ML IJ SOSY
40.0000 mg | PREFILLED_SYRINGE | INTRAMUSCULAR | Status: DC
  Administered 2021-04-08: 40 mg via SUBCUTANEOUS
  Filled 2021-04-07: qty 0.4

## 2021-04-07 MED ORDER — ONDANSETRON 4 MG PO TBDP
4.0000 mg | ORAL_TABLET | Freq: Four times a day (QID) | ORAL | Status: DC
  Administered 2021-04-08 (×2): 4 mg via ORAL
  Filled 2021-04-07 (×2): qty 1

## 2021-04-07 MED ORDER — AMISULPRIDE (ANTIEMETIC) 5 MG/2ML IV SOLN: 10.0000 mg | Freq: Once | INTRAVENOUS | Status: DC | PRN

## 2021-04-07 MED ORDER — DEXMEDETOMIDINE (PRECEDEX) IN NS 20 MCG/5ML (4 MCG/ML) IV SYRINGE
PREFILLED_SYRINGE | INTRAVENOUS | Status: DC | PRN
  Administered 2021-04-07: 20 ug via INTRAVENOUS

## 2021-04-07 MED ORDER — LIDOCAINE 2% (20 MG/ML) 5 ML SYRINGE
INTRAMUSCULAR | Status: AC
  Filled 2021-04-07: qty 5

## 2021-04-07 MED ORDER — BUPIVACAINE-EPINEPHRINE 0.25% -1:200000 IJ SOLN
INTRAMUSCULAR | Status: DC | PRN
  Administered 2021-04-07: 30 mL

## 2021-04-07 MED ORDER — HYDRALAZINE HCL 20 MG/ML IJ SOLN: 10.0000 mg | INTRAMUSCULAR | Status: DC | PRN

## 2021-04-07 MED ORDER — HYDROCHLOROTHIAZIDE 12.5 MG PO CAPS
12.5000 mg | ORAL_CAPSULE | Freq: Every day | ORAL | Status: DC
  Administered 2021-04-08: 12.5 mg via ORAL
  Filled 2021-04-07: qty 1

## 2021-04-07 MED ORDER — ONDANSETRON HCL 4 MG/2ML IJ SOLN: 4.0000 mg | Freq: Once | INTRAMUSCULAR | Status: DC | PRN

## 2021-04-07 MED ORDER — LIDOCAINE HCL (PF) 1 % IJ SOLN
INTRAMUSCULAR | Status: AC
  Filled 2021-04-07: qty 30

## 2021-04-07 MED ORDER — POTASSIUM CHLORIDE IN NACL 20-0.45 MEQ/L-% IV SOLN
INTRAVENOUS | Status: DC
  Filled 2021-04-07 (×3): qty 1000

## 2021-04-07 MED ORDER — FLUTICASONE PROPIONATE 50 MCG/ACT NA SUSP
2.0000 | Freq: Every day | NASAL | Status: DC
  Administered 2021-04-07 – 2021-04-08 (×2): 2 via NASAL
  Filled 2021-04-07: qty 16

## 2021-04-07 MED ORDER — OXYCODONE HCL 5 MG/5ML PO SOLN: 5.0000 mg | Freq: Once | ORAL | Status: AC | PRN

## 2021-04-07 MED ORDER — FENTANYL CITRATE (PF) 100 MCG/2ML IJ SOLN
INTRAMUSCULAR | Status: DC | PRN
  Administered 2021-04-07: 50 ug via INTRAVENOUS
  Administered 2021-04-07: 150 ug via INTRAVENOUS
  Administered 2021-04-07 (×3): 50 ug via INTRAVENOUS

## 2021-04-07 MED ORDER — HYDROMORPHONE HCL 1 MG/ML IJ SOLN
0.2500 mg | INTRAMUSCULAR | Status: DC | PRN
  Administered 2021-04-07 (×2): 0.5 mg via INTRAVENOUS

## 2021-04-07 MED ORDER — HEPARIN SODIUM (PORCINE) 5000 UNIT/ML IJ SOLN
5000.0000 [IU] | Freq: Once | INTRAMUSCULAR | Status: AC
  Administered 2021-04-07: 5000 [IU] via SUBCUTANEOUS
  Filled 2021-04-07: qty 1

## 2021-04-07 MED ORDER — LACTATED RINGERS IV SOLN: INTRAVENOUS | Status: DC

## 2021-04-07 MED ORDER — SODIUM CHLORIDE 0.9 % IR SOLN
Status: DC | PRN
  Administered 2021-04-07: 1000 mL

## 2021-04-07 MED ORDER — ACETAMINOPHEN 10 MG/ML IV SOLN
1000.0000 mg | Freq: Once | INTRAVENOUS | Status: DC | PRN
  Administered 2021-04-07: 1000 mg via INTRAVENOUS

## 2021-04-07 MED ORDER — PROPOFOL 10 MG/ML IV BOLUS
INTRAVENOUS | Status: AC
  Filled 2021-04-07: qty 40

## 2021-04-07 MED ORDER — INSULIN ASPART 100 UNIT/ML IJ SOLN
0.0000 [IU] | INTRAMUSCULAR | Status: DC
  Administered 2021-04-07 (×2): 2 [IU] via SUBCUTANEOUS
  Administered 2021-04-07: 3 [IU] via SUBCUTANEOUS

## 2021-04-07 MED ORDER — PROPOFOL 10 MG/ML IV BOLUS
INTRAVENOUS | Status: DC | PRN
  Administered 2021-04-07: 50 mg via INTRAVENOUS
  Administered 2021-04-07: 200 mg via INTRAVENOUS

## 2021-04-07 MED ORDER — BUPIVACAINE-EPINEPHRINE (PF) 0.25% -1:200000 IJ SOLN
INTRAMUSCULAR | Status: AC
  Filled 2021-04-07: qty 30

## 2021-04-07 MED ORDER — DEXAMETHASONE SODIUM PHOSPHATE 10 MG/ML IJ SOLN
INTRAMUSCULAR | Status: DC | PRN
  Administered 2021-04-07: 8 mg via INTRAVENOUS

## 2021-04-07 MED ORDER — FENTANYL CITRATE (PF) 250 MCG/5ML IJ SOLN
INTRAMUSCULAR | Status: AC
  Filled 2021-04-07: qty 5

## 2021-04-07 MED ORDER — SCOPOLAMINE 1 MG/3DAYS TD PT72
1.0000 | MEDICATED_PATCH | Freq: Once | TRANSDERMAL | Status: DC
  Administered 2021-04-07: 1.5 mg via TRANSDERMAL
  Filled 2021-04-07: qty 1

## 2021-04-07 MED ORDER — SUGAMMADEX SODIUM 200 MG/2ML IV SOLN
INTRAVENOUS | Status: DC | PRN
  Administered 2021-04-07 (×2): 200 mg via INTRAVENOUS

## 2021-04-07 MED ORDER — DEXAMETHASONE SODIUM PHOSPHATE 4 MG/ML IJ SOLN
4.0000 mg | INTRAMUSCULAR | Status: DC
  Administered 2021-04-07: 4 mg via INTRAVENOUS

## 2021-04-07 MED ORDER — DIPHENHYDRAMINE HCL 12.5 MG/5ML PO ELIX: 12.5000 mg | ORAL_SOLUTION | Freq: Four times a day (QID) | ORAL | Status: DC | PRN

## 2021-04-07 MED ORDER — GABAPENTIN 250 MG/5ML PO SOLN
300.0000 mg | Freq: Two times a day (BID) | ORAL | Status: DC
  Administered 2021-04-07 – 2021-04-08 (×3): 300 mg via ORAL
  Filled 2021-04-07 (×4): qty 6

## 2021-04-07 MED ORDER — ONDANSETRON HCL 4 MG/2ML IJ SOLN
INTRAMUSCULAR | Status: DC | PRN
  Administered 2021-04-07: 4 mg via INTRAVENOUS

## 2021-04-07 MED ORDER — GABAPENTIN 300 MG PO CAPS
300.0000 mg | ORAL_CAPSULE | ORAL | Status: AC
  Administered 2021-04-07: 300 mg via ORAL
  Filled 2021-04-07: qty 1

## 2021-04-07 SURGICAL SUPPLY — 57 items
APPLIER CLIP ROT 10 11.4 M/L (STAPLE)
BAG COUNTER SPONGE SURGICOUNT (BAG) IMPLANT
BENZOIN TINCTURE PRP APPL 2/3 (GAUZE/BANDAGES/DRESSINGS) IMPLANT
CABLE HIGH FREQUENCY MONO STRZ (ELECTRODE) IMPLANT
CHLORAPREP W/TINT 26 (MISCELLANEOUS) ×4 IMPLANT
CLIP APPLIE ROT 10 11.4 M/L (STAPLE) IMPLANT
DECANTER SPIKE VIAL GLASS SM (MISCELLANEOUS) IMPLANT
DEVICE SUT QUICK LOAD TK 5 (STAPLE) ×8 IMPLANT
DEVICE SUT TI-KNOT TK 5X26 (MISCELLANEOUS) ×4 IMPLANT
DEVICE SUTURE ENDOST 10MM (ENDOMECHANICALS) ×4 IMPLANT
DISSECTOR BLUNT TIP ENDO 5MM (MISCELLANEOUS) ×4 IMPLANT
DRAIN CHANNEL 19F RND (DRAIN) ×4 IMPLANT
DRAIN PENROSE 0.5X18 (DRAIN) ×4 IMPLANT
DRSG TEGADERM 2-3/8X2-3/4 SM (GAUZE/BANDAGES/DRESSINGS) ×4 IMPLANT
DRSG TEGADERM 4X4.75 (GAUZE/BANDAGES/DRESSINGS) ×4 IMPLANT
ELECT L-HOOK LAP 45CM DISP (ELECTROSURGICAL) ×4
ELECT REM PT RETURN 15FT ADLT (MISCELLANEOUS) ×4 IMPLANT
ELECTRODE L-HOOK LAP 45CM DISP (ELECTROSURGICAL) ×3 IMPLANT
EVACUATOR SILICONE 100CC (DRAIN) ×4 IMPLANT
GAUZE SPONGE 2X2 8PLY STRL LF (GAUZE/BANDAGES/DRESSINGS) ×3 IMPLANT
GLOVE SURG POLY ORTHO LF SZ7.5 (GLOVE) ×4 IMPLANT
GLOVE SURG UNDER POLY LF SZ7 (GLOVE) IMPLANT
GOWN STRL REUS W/TWL LRG LVL3 (GOWN DISPOSABLE) ×4 IMPLANT
GOWN STRL REUS W/TWL XL LVL3 (GOWN DISPOSABLE) ×12 IMPLANT
GRASPER SUT TROCAR 14GX15 (MISCELLANEOUS) ×4 IMPLANT
KIT BASIN OR (CUSTOM PROCEDURE TRAY) ×4 IMPLANT
KIT TURNOVER KIT A (KITS) ×4 IMPLANT
NS IRRIG 1000ML POUR BTL (IV SOLUTION) ×4 IMPLANT
PACK UNIVERSAL I (CUSTOM PROCEDURE TRAY) ×4 IMPLANT
PENCIL SMOKE EVACUATOR (MISCELLANEOUS) IMPLANT
SCISSORS LAP 5X45 EPIX DISP (ENDOMECHANICALS) ×4 IMPLANT
SET IRRIG TUBING LAPAROSCOPIC (IRRIGATION / IRRIGATOR) ×4 IMPLANT
SET TUBE SMOKE EVAC HIGH FLOW (TUBING) ×4 IMPLANT
SHEARS HARMONIC ACE PLUS 45CM (MISCELLANEOUS) ×4 IMPLANT
SLEEVE XCEL OPT CAN 5 100 (ENDOMECHANICALS) ×12 IMPLANT
SPONGE DRAIN TRACH 4X4 STRL 2S (GAUZE/BANDAGES/DRESSINGS) ×4 IMPLANT
SPONGE GAUZE 2X2 STER 10/PKG (GAUZE/BANDAGES/DRESSINGS) ×1
STRIP CLOSURE SKIN 1/2X4 (GAUZE/BANDAGES/DRESSINGS) ×4 IMPLANT
SUT ETHIBOND 2 0 SH (SUTURE)
SUT ETHIBOND 2 0 SH 36X2 (SUTURE) IMPLANT
SUT ETHILON 2 0 PS N (SUTURE) ×4 IMPLANT
SUT MNCRL AB 4-0 PS2 18 (SUTURE) ×4 IMPLANT
SUT SURGIDAC NAB ES-9 0 48 120 (SUTURE) ×28 IMPLANT
SUT V-LOC BARB 180 2/0GR6 GS22 (SUTURE) ×4
SUT VICRYL 0 27 CT2 27 ABS (SUTURE) ×4 IMPLANT
SUTURE V-LC BRB 180 2/0GR6GS22 (SUTURE) ×3 IMPLANT
TIP INNERVISION DETACH 40FR (MISCELLANEOUS) IMPLANT
TIP INNERVISION DETACH 50FR (MISCELLANEOUS) IMPLANT
TIP INNERVISION DETACH 56FR (MISCELLANEOUS) ×4 IMPLANT
TIPS INNERVISION DETACH 40FR (MISCELLANEOUS)
TOWEL OR 17X26 10 PK STRL BLUE (TOWEL DISPOSABLE) ×4 IMPLANT
TOWEL OR NON WOVEN STRL DISP B (DISPOSABLE) IMPLANT
TRAY FOLEY MTR SLVR 16FR STAT (SET/KITS/TRAYS/PACK) IMPLANT
TRAY LAPAROSCOPIC (CUSTOM PROCEDURE TRAY) ×4 IMPLANT
TROCAR BLADELESS OPT 5 100 (ENDOMECHANICALS) ×4 IMPLANT
TROCAR XCEL BLUNT TIP 100MML (ENDOMECHANICALS) IMPLANT
TROCAR XCEL NON-BLD 11X100MML (ENDOMECHANICALS) ×4 IMPLANT

## 2021-04-07 NOTE — Transfer of Care (Signed)
Immediate Anesthesia Transfer of Care Note  Patient: Kyle [name]  Procedure(s) Performed: LAPAROSCOPIC REPAIR OF HIATAL HERNIA WITH NISSEN FUNDOPLICATION WITH GASTROPATHY REPAIR AND UPPER ENDOSCOPY (Abdomen)  Patient Location: PACU  Anesthesia Type:General  Level of Consciousness: awake, oriented and patient cooperative  Airway & Oxygen Therapy: Patient Spontanous Breathing and Patient connected to face mask oxygen  Post-op Assessment: Report given to RN and Post -op Vital signs reviewed and stable  Post vital signs: stable  Last Vitals:  Vitals Value Taken Time  BP 126/57 04/07/21 1046  Temp    Pulse 75 04/07/21 1049  Resp 11 04/07/21 1049  SpO2 98 % 04/07/21 1049  Vitals shown include unvalidated device data.  Last Pain:  Vitals:   04/07/21 0606  TempSrc: Oral  PainSc:          Complications: No notable events documented.

## 2021-04-07 NOTE — Interval H&P Note (Signed)
History and Physical Interval Note:  04/07/2021 8:28 AM  Kyle [name]  has presented today for surgery, with the diagnosis of HIATAL HERNIA WITH GERD.  The various methods of treatment have been discussed with the patient and family. After consideration of risks, benefits and other options for treatment, the patient has consented to  Procedure(s): Nichols (N/A) ESOPHAGOGASTRODUODENOSCOPY (EGD) (N/A) TRANSORAL INCISIONLESS FUNDOPLICATION (N/A) as a surgical intervention.  The patient's history has been reviewed, patient examined, no change in status, stable for surgery.  I have reviewed the patient's chart and labs.  Questions were answered to the patient's satisfaction.     [name] [name]

## 2021-04-07 NOTE — Progress Notes (Signed)
[facility]  45 year old man with a history of depression, anxiety, HTN, HLD, follows in the GI clinic for reflux and hiatal hernia.   Has a longstanding history of reflux for the last 15-20 years, previously followed by GI in [place] prior to moving back to [place] in 03/2020. Index sxs of HB, regurgitation. Bothersome daytime sxs but also significant +nocturnal symptoms (choking, regurgitaiton, emesis).  Has even been hospitalized with aspiration PNA requiring intubation in 04/2020 presumed 2/2 nocturnal reflux.  Exacerbated by spicy foods, chocolate, coffee, supine, lying on right side.  Regurgitation with forward flexion.  Has trialed multiple medications in the past, to include Nexium, omeprazole, Tums, carafate.  Escalating doses over the years. Currently taking Protonix 40 mg bid which generally controls symptoms. Breakthrough with any missed doses.  Sleeps with HOB elevated, avoids eating within 3 hours of bedtime, and recently stopped evening caffeine intake, but can still have breakthrough nocturnal symptoms, specifically regurgitation, choking sensation.   Was recommended to consider antireflux surgery by his previous Gastroenterologist, but patient subsequently moved back to [place].   Labs on 05/12/2020 after hospital discharge from aspiration pneumonia notable for the following: -Normal CBC -Normal CMP   GERD-HRQL Questionnaire score: 35/50 (on PPI), and marked "dissatisfied" with current health condition     GERD work-up: -Reports having EGD years ago in [place]; cannot recall results - EGD (03/2020, [facility], Dr. Tora Kindred): Z-line irregular at 39 cm (path: Reflux changes without intestinal metaplasia), 1 cm HH, normal stomach and duodenum -EGD (09/2020, Dr. Bryan Lemma): 3 cm HH, Hill grade 4, fundic gland polyps -Esophagram (09/2020): Normal esophagus, normal motility.  Positive gastroesophageal reflux -GES: 01/2021: Normal     -Colonoscopy (09/2020, Dr.  Bryan Lemma): 6 mm polyp (path benign), internal hemorrhoids, normal TI.  Repeat 10 years   Objective: Vital signs in last 24 hours: Temp:  [98.1 F (36.7 C)] 98.1 F (36.7 C) (07/14 0606) Pulse Rate:  [68] 68 (07/14 0606) Resp:  [16] 16 (07/14 0606) BP: (118)/(77) 118/77 (07/14 0606) SpO2:  [99 %] 99 % (07/14 0606) Weight:  [102.1 kg] 102.1 kg (07/14 0549)   General: NAD Lungs:  CTA b/l, no w/r/r Heart:  RRR, no m/r/g Abdomen:  Soft, NT, ND, +BS Ext:  No c/c/e    Intake/Output from previous day: No intake/output data recorded. Intake/Output this shift: No intake/output data recorded.   Lab Results: No results for input(s): WBC, HGB, PLT, MCV in the last 72 hours. BMET No results for input(s): NA, K, CL, CO2, GLUCOSE, BUN, CREATININE, CALCIUM in the last 72 hours. LFT No results for input(s): PROT, ALBUMIN, AST, ALT, ALKPHOS, BILITOT, BILIDIR, IBILI in the last 72 hours. PT/INR No results for input(s): INR in the last 72 hours.    Imaging/Other results: No results found.    Assessment and Plan:  1) GERD 2) Hiatal hernia - Plan for lap hiatal hernia repair and TIF today in the OR - Will plan to admit overnight for observation     Lavena Bullion, DO  04/07/2021, 8:20 AM [facility] Pager [phone]

## 2021-04-07 NOTE — Anesthesia Procedure Notes (Signed)
Procedure Name: Intubation Date/Time: 04/07/2021 7:50 AM Performed by: Pilar Grammes, CRNA Pre-anesthesia Checklist: Patient identified, Emergency Drugs available, Suction available, Patient being monitored and Timeout performed Patient Re-evaluated:Patient Re-evaluated prior to induction Oxygen Delivery Method: Circle system utilized Preoxygenation: Pre-oxygenation with 100% oxygen Induction Type: IV induction Ventilation: Mask ventilation without difficulty and Two handed mask ventilation required Laryngoscope Size: Miller and 2 Grade View: Grade II Tube type: Oral Tube size: 7.5 mm Number of attempts: 1 Airway Equipment and Method: Stylet Placement Confirmation: positive ETCO2, ETT inserted through vocal cords under direct vision, CO2 detector and breath sounds checked- equal and bilateral Secured at: 24 cm Tube secured with: Tape Dental Injury: Teeth and Oropharynx as per pre-operative assessment

## 2021-04-07 NOTE — Op Note (Addendum)
04/07/2021  11:03 AM  PATIENT:  Kyle [name]  45 y.o. male  PRE-OPERATIVE DIAGNOSIS:  HIATAL HERNIA WITH GERD  POST-OPERATIVE DIAGNOSIS:  HIATAL HERNIA WITH GERD  PROCEDURE:  Procedure(s): LAPAROSCOPIC REPAIR OF HIATAL HERNIA WITH NISSEN FUNDOPLICATION WITH GASTRORRPATHY REPAIR AND UPPER ENDOSCOPY LAPAROSCOPIC BILATERAL TAP BLOCK  SURGEON:  Surgeon(s): Greer Pickerel, MD   ASSISTANTSMichael Boston, MD, Judyann Munson RNFA   ANESTHESIA:   general  DRAINS: (19fr) Jackson-Pratt drain(s) with closed bulb suction in the MS and along diaphragm closure    LOCAL MEDICATIONS USED:  MARCAINE    and OTHER exparel  EBL: 25 cc  SPECIMEN:  Source of Specimen:  lipoma along the distal posterior esophagus  DISPOSITION OF SPECIMEN:  DISCARDED  COUNTS:  YES  INDICATION FOR PROCEDURE: Patient is a 45 year old male who is referred for hiatal hernia repair with concomitant TIF procedure.  He has a longstanding history of GERD for over 15 years.  He has had several hospitalizations for aspiration pneumonia as well.  He was interested in combined surgical repair with endoscopic fundoplication.  Please see outside records for additional information  PROCEDURE: Patient was given oral Tylenol and gabapentin preoperatively along with Decadron.  He was also given 5000 units of subcutaneous heparin.  After consent was obtained he was taken to the OR 5 at [facility] placed supine on the operating room table.  General endotracheal anesthesia was obtained.  Sequential compression devices were placed.  His abdomen was prepped and draped in the usual standard surgical fashion with ChloraPrep.  IV antibiotic was administered.  Surgical timeout was performed.  Access to the abdomen was achieved in the left upper quadrant at Palmer's point using a 5 mm Optiview camera with a 5 mm trocar.  It was advanced under direct visualization into the abdominal cavity.  Pneumoperitoneum was smoothly established without  any change in patient vital signs.  There is no evidence of injury to surrounding structures.  Patient was placed in reverse Trendelenburg.  A 5 mm trocar was placed slightly above into the left of the umbilicus.  A 5 mm trocar in the right lateral abdominal wall and an 11 mm trocar in the right midabdomen.  An additional 5 mm trocar was placed in the left midaxillary line and a 5 Miller trocar was placed in the subxiphoid position for the [facility] liver retractor.  There was some bleeding from the trocar site in the subxiphoid position.  This was taking care of with hook electrocautery.  The left lobe of the liver was lifted up to expose the hiatus.  A bilateral laparoscopic Exparel Marcaine tap block was performed under direct visualization for postoperative pain relief.  There is no overt gap at the hiatus.  But there was weakness and he could easily reduce some of the stomach into the hiatus.  The gastrohepatic ligament was incised with harmonic scalpel.  My assistant retracted the lesser curvature perigastric fat to patient's left side .  Right crus was identified.  I took down anterior attachments with harmonic scalpel.  The peritoneum directly over the right crus was identified and gently incised with harmonic scalpel.  Then using gentle blunt dissection between a Prestige grasper as well as a laparoscopic Kitner I started mobilizing along the right side in the mediastinum and along the esophagus and crossing anteriorly.  It was quite stuck anteriorly.  We took our time and use gentle atraumatic dissection of this area using Kitner and procedure grasper and using harmonic scalpel.  I returned back to the right crus and identified the confluence of the left and right crus.  The posterior vagus nerve was identified.  Patient had a very large stomach.  We decided that we needed to take down some short gastrics in order to further mobilize the fundus off the diaphragm and left crus.  My assistant held up some  short gastrics and I incised right next along the greater curvature in the distal fundal area.  We then took down the short gastrics in sequential fashion using harmonic scalpel all the way up to the left crus.  Upon retracting the stomach a gastrotomy was made in the posterior fundus.  This was not an unexpected event given the degree of inflammation at the hiatus.  A Penrose drain was placed around the distal esophagus in a retrogastric fashion in order for [place] to finish mobilizing the fundus off the left crus of the diaphragm.  This was done with harmonic scalpel.  At this point the fundus and distal esophagus was completely free of the hiatus and diaphragm.  Patient had a pretty generous lipoma of the right distal posterior esophagus.  At this point I asked Dr. Bryan Lemma to join [place] in the operating room so we could discuss the gastrotomy in the posterior fundus.  Edges were viable.  The gastrotomy was about 1.75 CM long.  There is no sign of thermal injury to this location.  We decided that the area could be primarily repaired but it would not be safe to do a TIF/endoscopic fundoplication.  After discussion with Dr. Bryan Lemma myself and my partner we decided that a surgical fundoplication would be the best way forward.  I primarily repaired the gastrotomy with a running 2 oh V-Loc suture and ran it back onto itself to imbricate the primary closure.  I then mobilized the posterior distal esophageal lipoma off of the esophagus initially with blunt dissection and then amputated it with harmonic scalpel and it was removed from the abdomen.  We continued with a circumferential mobilization of the esophagus in the mediastinum.  A small pleural defect was made on the right side without any change in patient vital signs or oxygenation ventilation status.  Aorta was identified.  We were able to mobilize the esophagus all the way to the level of the pericardium.  At this point there was at least 2 cm of intra-abdominal  esophageal length.  I then scrubbed out and performed endoscopy.  The endoscope was placed gently in the patient's oropharynx and gently glided down the esophagus and direct visualization.  There was no evidence of injury to the esophagus.  The Z-line was visualized within the abdominal cavity.  I then insufflated the stomach and retroflexed so I could visualize the fundus where the gastrorrhaphy was performed.  Meanwhile my partner had flooded the upper abdomen with saline.  There was no evidence of leak.  They repair appeared intact.  The stomach was desufflated and the endoscope was removed.  I then scrubbed back in.  At this point we brought the fundus retrogastric and I was able to easily perform [place] shoeshine maneuver.  Therefore I felt I had done enough mobilization of the greater curvature.  I then reapproximated the diaphragm and closed the left and right crura with 4 interrupted 0 Ethibond sutures using the Endo Stitch device with reduced pneumoperitoneum.  We then had the CRNA passed a 56 French bougie after I had brought the fundus posteriorly and a retrogastric/esophageal manner and again  I could easily perform a shoeshine maneuver.  I then performed a 2 cm classic Nissen fundoplication with 3 interrupted 0 Ethibond sutures each incorporating a seromuscular bite of the esophagus.  2 of these interrupted sutures were secured with a titanium tie knot.  The most proximal suture was tied to itself.  I also placed a gastropexy suture between the fundus left upper quadrant to the left crus of the diaphragm.  The gastrorrhaphy ended up being on the posterior side of the fundoplication on the patient's right side.  Bougie was removed.  I was able to place a Prestige grasper underneath the wrap as well as through the diaphragmatic closure.  We then obtained a 19 [place] round Blake drain placed some of it in the mediastinum and the other along the diaphragmatic closure abutting the gastrorrhaphy and then  brought it out through the right lateral 5 mm trocar site.  It was secured to the skin with a 2-0 nylon.  The 11 mm trocar was removed and the fascial defect was reapproximated with a 0 Vicryl with a PMI suture passer and additional local was infiltrated.  The [facility] liver retractor was removed.  There was no evidence of bleeding from the subxiphoid trocar site.  Remaining trochars were removed.  Skin incisions were closed with a 4 Monocryl in a subcuticular fashion followed by the application of Steri-Strips and Tegaderms.  All needle, instrument, and sponge counts were correct x2.  I updated the patient's wife about the intraoperative gastrorrhaphy and the rationale and the decision to perform a Nissen fundoplication in lieu of a TIF  PLAN OF CARE: Admit for overnight observation  PATIENT DISPOSITION:  PACU - hemodynamically stable.   Delay start of Pharmacological VTE agent (>24hrs) due to surgical blood loss or risk of bleeding:  no  Leighton Ruff. Redmond Pulling, MD, FACS General, Bariatric, & Minimally Invasive Surgery [facility], [place]

## 2021-04-07 NOTE — Anesthesia Postprocedure Evaluation (Signed)
Anesthesia Post Note  Patient: Kyle [name]  Procedure(s) Performed: LAPAROSCOPIC HIATAL HERNIA REPAIR WITH NISSEN FUNDOPLICATION AND GASTRORRHAPHY (Abdomen) UPPER GI ENDOSCOPY (Esophagus)     Patient location during evaluation: PACU Anesthesia Type: General Level of consciousness: awake and alert Pain management: pain level controlled Vital Signs Assessment: post-procedure vital signs reviewed and stable Respiratory status: spontaneous breathing, nonlabored ventilation, respiratory function stable and patient connected to nasal cannula oxygen Cardiovascular status: blood pressure returned to baseline and stable Postop Assessment: no apparent nausea or vomiting Anesthetic complications: no   No notable events documented.  Last Vitals:  Vitals:   04/07/21 1215 04/07/21 1230  BP: 128/88 135/81  Pulse: 80 76  Resp: (!) 21 11  Temp:    SpO2: 100% 99%    Last Pain:  Vitals:   04/07/21 1339  TempSrc:   PainSc: 7                  Barnet Glasgow

## 2021-04-07 NOTE — H&P (View-Only) (Signed)
[facility]  45 year old man with a history of depression, anxiety, HTN, HLD, follows in the GI clinic for reflux and hiatal hernia.   Has a longstanding history of reflux for the last 15-20 years, previously followed by GI in [place] prior to moving back to [place] in 03/2020. Index sxs of HB, regurgitation. Bothersome daytime sxs but also significant +nocturnal symptoms (choking, regurgitaiton, emesis).  Has even been hospitalized with aspiration PNA requiring intubation in 04/2020 presumed 2/2 nocturnal reflux.  Exacerbated by spicy foods, chocolate, coffee, supine, lying on right side.  Regurgitation with forward flexion.  Has trialed multiple medications in the past, to include Nexium, omeprazole, Tums, carafate.  Escalating doses over the years. Currently taking Protonix 40 mg bid which generally controls symptoms. Breakthrough with any missed doses.  Sleeps with HOB elevated, avoids eating within 3 hours of bedtime, and recently stopped evening caffeine intake, but can still have breakthrough nocturnal symptoms, specifically regurgitation, choking sensation.   Was recommended to consider antireflux surgery by his previous Gastroenterologist, but patient subsequently moved back to [place].   Labs on 05/12/2020 after hospital discharge from aspiration pneumonia notable for the following: -Normal CBC -Normal CMP   GERD-HRQL Questionnaire score: 35/50 (on PPI), and marked "dissatisfied" with current health condition     GERD work-up: -Reports having EGD years ago in [place]; cannot recall results - EGD (03/2020, [facility], Dr. Tora Kindred): Z-line irregular at 39 cm (path: Reflux changes without intestinal metaplasia), 1 cm HH, normal stomach and duodenum -EGD (09/2020, Dr. Bryan Lemma): 3 cm HH, Hill grade 4, fundic gland polyps -Esophagram (09/2020): Normal esophagus, normal motility.  Positive gastroesophageal reflux -GES: 01/2021: Normal     -Colonoscopy (09/2020, Dr.  Bryan Lemma): 6 mm polyp (path benign), internal hemorrhoids, normal TI.  Repeat 10 years   Objective: Vital signs in last 24 hours: Temp:  [98.1 F (36.7 C)] 98.1 F (36.7 C) (07/14 0606) Pulse Rate:  [68] 68 (07/14 0606) Resp:  [16] 16 (07/14 0606) BP: (118)/(77) 118/77 (07/14 0606) SpO2:  [99 %] 99 % (07/14 0606) Weight:  [102.1 kg] 102.1 kg (07/14 0549)   General: NAD Lungs:  CTA b/l, no w/r/r Heart:  RRR, no m/r/g Abdomen:  Soft, NT, ND, +BS Ext:  No c/c/e    Intake/Output from previous day: No intake/output data recorded. Intake/Output this shift: No intake/output data recorded.   Lab Results: No results for input(s): WBC, HGB, PLT, MCV in the last 72 hours. BMET No results for input(s): NA, K, CL, CO2, GLUCOSE, BUN, CREATININE, CALCIUM in the last 72 hours. LFT No results for input(s): PROT, ALBUMIN, AST, ALT, ALKPHOS, BILITOT, BILIDIR, IBILI in the last 72 hours. PT/INR No results for input(s): INR in the last 72 hours.    Imaging/Other results: No results found.    Assessment and Plan:  1) GERD 2) Hiatal hernia - Plan for lap hiatal hernia repair and TIF today in the OR - Will plan to admit overnight for observation     Lavena Bullion, DO  04/07/2021, 8:20 AM [facility] Pager [phone]

## 2021-04-07 NOTE — H&P (Signed)
CC: here for surgery  Requesting provider: dr Bryan Lemma  HPI: Kyle [name] is an 45 y.o. male who is here for lap hiatal hernia repair. Denies medical changes since seen in clinic. No f/c/n/v/d/c. No smoking. No blood thinners.  The patient is a 45 year old male who presents with gastroesophageal reflux disease. he is referred by dr Bryan Lemma to discuss TIF and hiatal hernia repair.  He reports a long-standing history of reflux.  He states that he's been on medications for 15-20 years.  He describes his reflux as a burning in his chest.  He initially started with over-the-counter medications but every the years progressed to needing prescription medications with escalating doses.  He is been on Protonix for quite some time he is now on it twice a day.  If he takes it twice a day his symptoms are fairly controlled especially during the daytime however he has breakthrough symptoms at night.  He will feel a choking sensation and actually throw up liquid.  He rarely regurgitates food.  He feels that he aspirates at night.  He reports several hospitalizations due to aspiration pneumonia.  Most recent one was back in the fall of 2021.  He states that other gastroenterologists and surgeons have recommended reflux surgery for him.  If he does not take his Protonix first thing in the morning he will started to have epigastric burning by 10 AM.  He does not sleep elevated.  He sleeps on his left side.  If he sleeps on his right side he will definitely have more episodes of coughing and choking.  No pain with swallowing solids or liquids.  No globus sensation.  No early satiety.  He does vape with nicotine.  He has purposely been trying to lose weight.   He had an upper GI on January 14 which showed GERD but no evidence of hiatal hernia.  Upon my review at think there is evidence of a hiatal hernia.   He has hypertension, hyperlipidemia and diabetes type 2 all those A1c most recently was 6.3.     egd with Dr  Bryan Lemma 10/11/19 (personally reviewed): - A 3 cm hiatal hernia was present. - The Z-line was regular and was found 37 cm from the incisors. - The gastroesophageal flap valve was visualized endoscopically and classified as Hill Grade IV (no fold, wide open lumen, hiatal hernia present). - A few small sessile polyps with no stigmata of recent bleeding were found in the gastric fundus and in the gastric body. A few of these polyps were removed with a cold biopsy forceps for histologic representative evaluation. Resection and retrieval were complete. Estimated blood loss was minimal. - The incisura, gastric antrum and pylorus were normal. - The examined duodenum was normal.   GERD-HRQL Questionnaire score: 35/50 (on PPI), and marked "dissatisfied" with current health condition     Endoscopic Hx: - EGD (03/2020, [facility], Dr. Tora Kindred): Z-line irregular at 39 cm (path: Reflux changes without intestinal metaplasia), 1 cm HH, normal stomach and duodenum -Reports having EGD years ago in [place]; cannot recall results  Past Medical History:  Diagnosis Date   Allergy    Anemia    Anxiety    patient states history    Cancer ([place])    Depression    history    Diabetes mellitus without complication ([place])    Elevated liver enzymes    Hyperlipidemia    Hypertension    Pneumonia     Past Surgical History:  Procedure Laterality Date   ESOPHAGOGASTRODUODENOSCOPY     2021 [facility]   left leg surgery     SINUSOTOMY     TONSILLECTOMY AND ADENOIDECTOMY     WRIST FRACTURE SURGERY      Family History  Problem Relation Age of Onset   Healthy Mother    Hypertension Father    Hyperlipidemia Father    Prostate cancer Father    Bladder Cancer Father    Birth defects Maternal Grandfather    Bone cancer Maternal Grandfather    Birth defects Paternal Grandfather    Cancer Maternal Uncle     Social:  reports that he has quit smoking. He quit  smokeless tobacco use about 3 years ago. He reports previous alcohol use of about 4.0 standard drinks of alcohol per week. He reports previous drug use. Drug: Marijuana.  Allergies:  Allergies  Allergen Reactions   Benzonatate Other (See Comments)    Restless legs    Phenergan [Promethazine]     Restless legs    Medications: I have reviewed the patient's current medications.   ROS - all of the below systems have been reviewed with the patient and positives are indicated with bold text General: chills, fever or night sweats Eyes: blurry vision or double vision ENT: epistaxis or sore throat Allergy/Immunology: itchy/watery eyes or nasal congestion Hematologic/Lymphatic: bleeding problems, blood clots or swollen lymph nodes Endocrine: temperature intolerance or unexpected weight changes Breast: new or changing breast lumps or nipple discharge Resp: cough, shortness of breath, or wheezing CV: chest pain or dyspnea on exertion GI: as per HPI GU: dysuria, trouble voiding, or hematuria MSK: joint pain or joint stiffness Neuro: TIA or stroke symptoms Derm: pruritus and skin lesion changes Psych: anxiety and depression  PE Blood pressure 118/77, pulse 68, temperature 98.1 F (36.7 C), temperature source Oral, resp. rate 16, weight 102.1 kg, SpO2 99 %. Constitutional: NAD; conversant; no deformities Eyes: Moist conjunctiva; no lid lag; anicteric; PERRL Neck: Trachea midline; no thyromegaly Lungs: Normal respiratory effort; no tactile fremitus CV: RRR; no palpable thrills; no pitting edema GI: Abd soft nt; no palpable hepatosplenomegaly MSK: Normal gait; no clubbing/cyanosis Psychiatric: Appropriate affect; alert and oriented x3 Lymphatic: No palpable cervical or axillary lymphadenopathy Skin:no rash  Results for orders placed or performed during the hospital encounter of 04/07/21 (from the past 48 hour(s))  ABO/Rh     Status: None   Collection Time: 04/07/21  5:50 AM  Result  Value Ref Range   ABO/RH(D)      A NEG Performed at [facility], [place] [address], [place],  [address]   Glucose, capillary     Status: Abnormal   Collection Time: 04/07/21  6:02 AM  Result Value Ref Range   Glucose-Capillary 129 (H) 70 - 99 mg/dL    Comment: Glucose reference range applies only to samples taken after fasting for at least 8 hours.    No results found.  Imaging: reviewed  A/P: Kyle [name] is an 45 y.o. male with gerd/hiatal hernia To OR for lap hiatal hernia with TIF Eras IV abx Scds, preop meds  Leighton Ruff. Redmond Pulling, MD, FACS General, Bariatric, & Minimally Invasive Surgery [facility], [place]

## 2021-04-08 ENCOUNTER — Encounter (HOSPITAL_COMMUNITY): Payer: Self-pay | Admitting: General Surgery

## 2021-04-08 ENCOUNTER — Observation Stay (HOSPITAL_COMMUNITY): Payer: BC Managed Care – PPO

## 2021-04-08 DIAGNOSIS — I10 Essential (primary) hypertension: Secondary | ICD-10-CM | POA: Diagnosis not present

## 2021-04-08 DIAGNOSIS — Z859 Personal history of malignant neoplasm, unspecified: Secondary | ICD-10-CM | POA: Diagnosis not present

## 2021-04-08 DIAGNOSIS — E119 Type 2 diabetes mellitus without complications: Secondary | ICD-10-CM | POA: Diagnosis not present

## 2021-04-08 DIAGNOSIS — Z79899 Other long term (current) drug therapy: Secondary | ICD-10-CM | POA: Diagnosis not present

## 2021-04-08 DIAGNOSIS — Z87891 Personal history of nicotine dependence: Secondary | ICD-10-CM | POA: Diagnosis not present

## 2021-04-08 DIAGNOSIS — K219 Gastro-esophageal reflux disease without esophagitis: Secondary | ICD-10-CM | POA: Diagnosis not present

## 2021-04-08 DIAGNOSIS — K449 Diaphragmatic hernia without obstruction or gangrene: Secondary | ICD-10-CM | POA: Diagnosis not present

## 2021-04-08 LAB — GLUCOSE, CAPILLARY
Glucose-Capillary: 109 mg/dL — ABNORMAL HIGH (ref 70–99)
Glucose-Capillary: 111 mg/dL — ABNORMAL HIGH (ref 70–99)
Glucose-Capillary: 89 mg/dL (ref 70–99)
Glucose-Capillary: 83 mg/dL (ref 70–99)

## 2021-04-08 LAB — CBC
HCT: 37 % — ABNORMAL LOW (ref 39.0–52.0)
Hemoglobin: 12 g/dL — ABNORMAL LOW (ref 13.0–17.0)
MCH: 29.1 pg (ref 26.0–34.0)
MCHC: 32.4 g/dL (ref 30.0–36.0)
MCV: 89.6 fL (ref 80.0–100.0)
Platelets: 182 10*3/uL (ref 150–400)
RBC: 4.13 MIL/uL — ABNORMAL LOW (ref 4.22–5.81)
RDW: 13.5 % (ref 11.5–15.5)
WBC: 11.9 10*3/uL — ABNORMAL HIGH (ref 4.0–10.5)
nRBC: 0 % (ref 0.0–0.2)

## 2021-04-08 LAB — BASIC METABOLIC PANEL
Anion gap: 7 (ref 5–15)
BUN: 19 mg/dL (ref 6–20)
CO2: 26 mmol/L (ref 22–32)
Calcium: 8.8 mg/dL — ABNORMAL LOW (ref 8.9–10.3)
Chloride: 103 mmol/L (ref 98–111)
Creatinine, Ser: 0.85 mg/dL (ref 0.61–1.24)
GFR, Estimated: >60 mL/min (ref >60)
Glucose, Bld: 116 mg/dL — ABNORMAL HIGH (ref 70–99)
Potassium: 4.7 mmol/L (ref 3.5–5.1)
Sodium: 136 mmol/L (ref 135–145)

## 2021-04-08 MED ORDER — OXYCODONE HCL 5 MG PO TABS: 5.0000 mg | ORAL_TABLET | Freq: Four times a day (QID) | ORAL | 0 refills | Status: DC | PRN

## 2021-04-08 MED ORDER — IBUPROFEN 600 MG PO TABS: 600.0000 mg | ORAL_TABLET | Freq: Three times a day (TID) | ORAL | 0 refills | Status: AC | PRN

## 2021-04-08 MED ORDER — IOPAMIDOL (ISOVUE-300) INJECTION 61%
50.0000 mL | Freq: Once | INTRAVENOUS | Status: AC | PRN
  Administered 2021-04-08: 50 mL via ORAL

## 2021-04-08 MED ORDER — ACETAMINOPHEN 500 MG PO TABS: 1000.0000 mg | ORAL_TABLET | Freq: Three times a day (TID) | ORAL | 0 refills | Status: AC

## 2021-04-08 MED ORDER — GABAPENTIN 300 MG PO CAPS: 300.0000 mg | ORAL_CAPSULE | Freq: Three times a day (TID) | ORAL | 0 refills | Status: DC

## 2021-04-08 MED ORDER — OXYCODONE HCL 5 MG/5ML PO SOLN
5.0000 mg | ORAL | Status: DC | PRN
  Administered 2021-04-08 (×2): 10 mg via ORAL
  Filled 2021-04-08 (×2): qty 10

## 2021-04-08 MED ORDER — ONDANSETRON HCL 4 MG PO TABS: 4.0000 mg | ORAL_TABLET | Freq: Three times a day (TID) | ORAL | 1 refills | Status: DC | PRN

## 2021-04-08 NOTE — Discharge Summary (Signed)
Physician Discharge Summary  Kyle [name] NTI:[id number] DOB: [birth year] DOA: 04/07/2021  PCP: Ronnald Nian, DO  Admit date: 04/07/2021 Discharge date: 04/08/2021  Recommendations for Outpatient Follow-up:     Follow-up Information     Greer Pickerel, MD Follow up on 04/21/2021.   Specialty: General Surgery Why: arrive by 9:15am, For wound re-check Contact information: [address] [place] [address] [phone]                Discharge Diagnoses:  GERD with hiatal hernia HPL HTN  Surgical Procedure: Status post laparoscopic hiatal hernia repair with Nissen fundoplication with gastrorrhaphy, upper endoscopy  Discharge Condition: good Disposition: home  Diet recommendation: full liquid diet  Filed Weights   04/07/21 0549 04/07/21 1230  Weight: 102.1 kg 102.1 kg    History of present illness:  Patient presented for elective repair of hiatal hernia and transoral incision was fundoplication for GERD and hiatal hernia.  Please see outside records for additional information  Hospital Course:  Patient received preoperative enhanced recovery medications.  He was taken the operating room for laparoscopic hiatal hernia repair.  His hiatal hernia was approximately 4 cm.  It was evident that this was a chronic problem due to the chronic inflammation at the hiatus.  In mobilizing the fundus of the stomach a gastrotomy was made which was repaired primarily in 2 layers.  Intraoperative consultation was performed with Dr. Bryan Lemma.  Because this portion of the stomach would be involved with the TIF wrap we all felt that the safest course of action was to proceed with a classic surgical Nissen fundoplication.  A surgical drain was left in place.  On postoperative day 1 he underwent an upper GI which demonstrated no extravasation of contrast.  No leak.  Contrast went through the wrap.  He was tolerating liquids.  He did have some pain issues requiring opioids in  addition to his scheduled nonopioid pain medication.  By the end of postoperative day 1 his pain was well controlled.  He was without fever or tachycardia.  His surgical drain was serosanguineous.  We had had a lengthy discussion about postoperative diet protocol as well as diet progression and eating techniques.  He had been instructed on surgical drain care.   Pt seen & examined earlier today  BP 123/78 (BP Location: Right Arm)   Pulse 67   Temp 98.1 F (36.7 C) (Oral)   Resp 18   Ht 6\' 2"  (1.88 m)   Wt 102.1 kg   SpO2 98%   BMI 28.90 kg/m   Gen: alert, NAD, non-toxic appearing Pupils: equal, no scleral icterus Pulm: Lungs clear to auscultation, symmetric chest rise CV: regular rate and rhythm Abd: soft, mild approp tender, nondistended. Jp-serosang. No cellulitis. No incisional hernia Ext: no edema, no calf tenderness Skin: no rash, no jaundice   Discharge Instructions  Discharge Instructions     Call MD for:   Complete by: As directed    Temperature >101   Call MD for:  hives   Complete by: As directed    Call MD for:  persistant dizziness or light-headedness   Complete by: As directed    Call MD for:  persistant nausea and vomiting   Complete by: As directed    Call MD for:  redness, tenderness, or signs of infection (pain, swelling, redness, odor or green/yellow discharge around incision site)   Complete by: As directed    Call MD for:  severe uncontrolled pain  Complete by: As directed    Diet full liquid   Complete by: As directed    Discharge instructions   Complete by: As directed    See [facility] discharge instructions   Increase activity slowly   Complete by: As directed       Allergies as of 04/08/2021       Reactions   Benzonatate Other (See Comments)   Restless legs    Phenergan [promethazine]    Restless legs        Medication List     STOP taking these medications    amoxicillin-clavulanate 875-125 MG tablet Commonly known as:  Augmentin   chlorpheniramine-HYDROcodone 10-8 MG/5ML Suer Commonly known as: Tussionex Pennkinetic ER   varenicline 1 MG tablet Commonly known as: Chantix Continuing Month Pak   venlafaxine XR 37.5 MG 24 hr capsule Commonly known as: Effexor XR       TAKE these medications    acetaminophen 500 MG tablet Commonly known as: TYLENOL Take 2 tablets (1,000 mg total) by mouth every 8 (eight) hours for 5 days.   fenofibrate 160 MG tablet Take 1 tablet (160 mg total) by mouth daily.   gabapentin 300 MG capsule Commonly known as: Neurontin Take 1 capsule (300 mg total) by mouth 3 (three) times daily for 10 days.   hydrochlorothiazide 12.5 MG capsule Commonly known as: Microzide Take 1 capsule (12.5 mg total) by mouth daily.   ibuprofen 600 MG tablet Commonly known as: ADVIL Take 1 tablet (600 mg total) by mouth every 8 (eight) hours as needed for up to 5 days for moderate pain.   levocetirizine 5 MG tablet Commonly known as: Xyzal Allergy 24HR Take 1 tablet (5 mg total) by mouth every evening.   losartan 25 MG tablet Commonly known as: COZAAR Take 25 mg by mouth daily.   metFORMIN 500 MG 24 hr tablet Commonly known as: Glucophage XR Take 1 tablet (500 mg total) by mouth daily with breakfast.   ondansetron 4 MG tablet Commonly known as: Zofran Take 1 tablet (4 mg total) by mouth every 8 (eight) hours as needed for nausea or vomiting.   OVER THE COUNTER MEDICATION Take 1 Scoop by mouth daily. Athletic Greens   OVER THE COUNTER MEDICATION Take 1 drop by mouth daily. Vit D, and Vit K   oxyCODONE 5 MG immediate release tablet Commonly known as: Oxy IR/ROXICODONE Take 1-2 tablets (5-10 mg total) by mouth every 6 (six) hours as needed for breakthrough pain.       ASK your doctor about these medications    fluticasone 50 MCG/ACT nasal spray Commonly known as: FLONASE Place 2 sprays into both nostrils daily.   pantoprazole 40 MG tablet Commonly known as:  PROTONIX TAKE ONE TABLET BY MOUTH TWICE A DAY        Follow-up Information     Greer Pickerel, MD Follow up on 04/21/2021.   Specialty: General Surgery Why: arrive by 9:15am, For wound re-check Contact information: [place] [address] [phone]                  The results of significant diagnostics from this hospitalization (including imaging, microbiology, ancillary and laboratory) are listed below for reference.    Significant Diagnostic Studies: DG UGI W SINGLE CM (SOL OR THIN BA)  Result Date: 04/08/2021 CLINICAL DATA:  Status post laparoscopic fundoplication. EXAM: WATER SOLUBLE UPPER GI SERIES TECHNIQUE: Single-column upper GI series was performed using water soluble contrast.  CONTRAST:  Omnipaque 300 COMPARISON:  10/08/2020 esophagram FLUOROSCOPY TIME:  Fluoroscopy Time:  2 minutes and 6 seconds Radiation Exposure Index (if provided by the fluoroscopic device): 60.4 mGy Number of Acquired Spot Images: 0 FINDINGS: Preprocedure scout film demonstrates right hemidiaphragm elevation. Surgical drain terminates at the gastroesophageal junction. Non-obstructive bowel gas pattern. Mild delay of passage of contrast at the gastroesophageal junction, likely related to postoperative edema. Expected narrowing at the a fundoplication site. Normal appearance of the stomach and opacified small bowel loops. No contrast extravasation. IMPRESSION: Expected appearance after hernia repair. Electronically Signed   By: Abigail Miyamoto M.D.   On: 04/08/2021 09:23    Microbiology: Recent Results (from the past 240 hour(s))  SARS CORONAVIRUS 2 (TAT 6-24 HRS) Nasopharyngeal Nasopharyngeal Swab     Status: None   Collection Time: 04/04/21  8:30 AM   Specimen: Nasopharyngeal Swab  Result Value Ref Range Status   SARS Coronavirus 2 NEGATIVE NEGATIVE Final    Comment: (NOTE) SARS-CoV-2 target nucleic acids are NOT DETECTED.  The SARS-CoV-2 RNA is generally detectable in  upper and lower respiratory specimens during the acute phase of infection. Negative results do not preclude SARS-CoV-2 infection, do not rule out co-infections with other pathogens, and should not be used as the sole basis for treatment or other patient management decisions. Negative results must be combined with clinical observations, patient history, and epidemiological information. The expected result is Negative.  Fact Sheet for Patients: SugarRoll.be  Fact Sheet for Healthcare Providers: https://www.woods-mathews.com/  This test is not yet approved or cleared by the [place] FDA and  has been authorized for detection and/or diagnosis of SARS-CoV-2 by FDA under an Emergency Use Authorization (EUA). This EUA will remain  in effect (meaning this test can be used) for the duration of the COVID-19 declaration under Se ction 564(b)(1) of the Act, 21 U.S.C. section 360bbb-3(b)(1), unless the authorization is terminated or revoked sooner.  Performed at [facility], [address] [place], [place] [address]      Labs: Basic Metabolic Panel: Recent Labs  Lab 04/08/21 0404  NA 136  K 4.7  CL 103  CO2 26  GLUCOSE 116*  BUN 19  CREATININE 0.85  CALCIUM 8.8*   Liver Function Tests: No results for input(s): AST, ALT, ALKPHOS, BILITOT, PROT, ALBUMIN in the last 168 hours. No results for input(s): LIPASE, AMYLASE in the last 168 hours. No results for input(s): AMMONIA in the last 168 hours. CBC: Recent Labs  Lab 04/08/21 0404  WBC 11.9*  HGB 12.0*  HCT 37.0*  MCV 89.6  PLT 182   Cardiac Enzymes: No results for input(s): CKTOTAL, CKMB, CKMBINDEX, TROPONINI in the last 168 hours. BNP: BNP (last 3 results) Recent Labs    05/02/20 0531  BNP 386.1*    ProBNP (last 3 results) No results for input(s): PROBNP in the last 8760 hours.  CBG: Recent Labs  Lab 04/07/21 2337 04/08/21 0351 04/08/21 0749 04/08/21 1206  04/08/21 1630  GLUCAP 149* 109* 111* 89 83    Active Problems:   S/P laparoscopic fundoplication   Time coordinating discharge: 47min  Signed:  Gayland Curry, MD [facility], [place] [phone] 04/08/2021, 5:49 PM

## 2021-04-08 NOTE — Progress Notes (Signed)
Mobility Specialist - Progress Note    04/08/21 1138  Mobility  Activity Ambulated in hall  Level of Assistance Standby assist, set-up cues, supervision of patient - no hands on  Assistive Device None  Distance Ambulated (ft) 450 ft  Mobility Ambulated independently in hallway  Mobility Response Tolerated well  Mobility performed by Mobility specialist (11:16-11:31)  $Mobility charge 1 Mobility   Pt ambulated 450 ft in hallway. Pt stated pain increased while ambulating, but no signs of dizziness or SOB were present. Pt returned to recliner per RN request. Pt left in room with call bell at side.   [place] Specialist Acute Rehabilitation Services Office: [phone] 04/08/21, 11:42 AM

## 2021-04-08 NOTE — Progress Notes (Signed)
Nurse reviewed discharge instructions with pt.  Pt verbalized understanding of discharge instructions, follow up appointments and new medications.  Pt given instructions on JP care and how to empty and recharge drain.  Pt demonstrated how to empty and recharge drain prior to discharge.  Pt ambulated from unit at time of discharge.

## 2021-04-08 NOTE — Discharge Instructions (Signed)
EATING AFTER YOUR ESOPHAGEAL SURGERY (Stomach Fundoplication, Hiatal Hernia repair, Achalasia surgery, etc)  ######################################################################  EAT Start with a pureed / full liquid diet (see below) Gradually transition to a high fiber diet with a fiber supplement over the next month after discharge.    WALK Walk an hour a day.  Control your pain to do that.    CONTROL PAIN Control pain so that you can walk, sleep, tolerate sneezing/coughing, go up/down stairs.  HAVE A BOWEL MOVEMENT DAILY Keep your bowels regular to avoid problems.  OK to try a laxative to override constipation.  OK to use an antidairrheal to slow down diarrhea.  Call if not better after 2 tries  CALL IF YOU HAVE PROBLEMS/CONCERNS Call if you are still struggling despite following these instructions. Call if you have concerns not answered by these instructions  ######################################################################   After your esophageal surgery, expect some sticking with swallowing over the next 1-2 months.    If food sticks when you eat, it is called "dysphagia".  This is due to swelling around your esophagus at the wrap & hiatal diaphragm repair.  It will gradually ease off over the next few months.  To help you through this temporary phase, we start you out on a full liquid diet.  Your first meal in the hospital was thin liquids.  You should have been given a full liquid diet by the time you left the hospital. Stay on clears and full liquids for the first few days. Once tolerating that well, you can advance to pureed diet.   We ask patients to stay on a pureed diet for the first 2-3 weeks to avoid anything getting "stuck" near your recent surgery.  Don't be alarmed if your ability to swallow doesn't progress according to this plan.  Everyone is different and some diets can advance more or less quickly.    It is often helpful to crush your medications or split  them as they can sometimes stick, especially the first week or so.   Some BASIC RULES to follow are: Maintain an upright position whenever eating or drinking. Take small bites - just a teaspoon size bite at a time. Eat slowly.  It may also help to eat only one food at a time. Consider nibbling through smaller, more frequent meals & avoid the urge to eat BIG meals Do not push through feelings of fullness, nausea, or bloatedness Do not mix solid foods and liquids in the same mouthful Try not to "wash foods down" with large gulps of liquids. Avoid carbonated (bubbly/fizzy) drinks.   Avoid foods that make you feel gassy or bloated.  Start with bland foods first.  Wait on trying greasy, fried, or spicy meals until you are tolerating more bland solids well. Understand that it will be hard to burp and belch at first.  This gradually improves with time.  Expect to be more gassy/flatulent/bloated initially.  Walking will help your body manage it better. Consider using medications for bloating that contain simethicone such as  Maalox or Gas-X  Consider crushing her medications, especially smaller pills.  The ability to swallow pills should get easier after a few weeks Eat in a relaxed atmosphere & minimize distractions. Avoid talking while eating.   Do not use straws. Following each meal, sit in an upright position (90 degree angle) for 60 to 90 minutes.  Going for a short walk can help as well If food does stick, don't panic.  Try to relax and let the  food pass on its own.  Sipping WARM LIQUID such as strong hot black tea can also help slide it down.   Be gradual in changes & use common sense:  -If you easily tolerating a certain "level" of foods, advance to the next level gradually -If you are having trouble swallowing a particular food, then avoid it.   -If food is sticking when you advance your diet, go back to thinner previous diet (the lower LEVEL) for 1-2 days.  LEVEL 2 = PUREED DIET  Do  for the first 2 WEEKS AFTER SURGERY AFTER YOU ARE TOLERATING A FULL LIQUID DIET EASILY  -Foods in this group are pureed or blenderized to a smooth, mashed potato-like consistency.  -If necessary, the pureed foods can keep their shape with the addition of a thickening agent.   -Meat should be pureed to a smooth, pasty consistency.  Hot broth or gravy may be added to the pureed meat, approximately 1 oz. of liquid per 3 oz. serving of meat. -CAUTION:  If any foods do not puree into a smooth consistency, swallowing will be more difficult.  (For example, nuts or seeds sometimes do not blend well.)  Hot Foods Cold Foods  Pureed scrambled eggs and cheese Pureed cottage cheese  Baby cereals Thickened juices and nectars  Thinned cooked cereals (no lumps) Thickened milk or eggnog  Pureed [place] toast or pancakes Ensure  Mashed potatoes Ice cream  Pureed parsley, au gratin, scalloped potatoes, candied sweet potatoes Fruit or [place] ice, sherbet  Pureed buttered or alfredo noodles Plain yogurt  Pureed vegetables (no corn or peas) Instant breakfast  Pureed soups and creamed soups Smooth pudding, mousse, custard  Pureed scalloped apples Whipped gelatin  Gravies Sugar, syrup, honey, jelly  Sauces, cheese, tomato, barbecue, white, creamed Cream  Any baby food Creamer  Alcohol in moderation (not beer or champagne) Margarine  Coffee or tea Mayonnaise   Ketchup, mustard   Apple sauce   SAMPLE MENU:  PUREED DIET Breakfast Lunch Dinner  Orange juice, 1/2 cup Cream of wheat, 1/2 cup Pineapple juice, 1/2 cup Pureed [place], barley soup, 3/4 cup Pureed Hawaiian chicken, 3 oz  Scrambled eggs, mashed or blended with cheese, 1/2 cup Tea or coffee, 1 cup  Whole milk, 1 cup  Non-dairy creamer, 2 Tbsp. Mashed potatoes, 1/2 cup Pureed cooled broccoli, 1/2 cup Apple sauce, 1/2 cup Coffee or tea Mashed potatoes, 1/2 cup Pureed spinach, 1/2 cup Frozen yogurt, 1/2 cup Tea or coffee      LEVEL 3 = SOFT  DIET  After your first 3 weeks, you can advance to a soft diet.   Keep on this diet until everything goes down easily.  Hot Foods Cold Foods  White fish Cottage cheese  Stuffed fish Junior baby fruit  Baby food meals Semi thickened juices  Minced soft cooked, scrambled, poached eggs nectars  Souffle & omelets Ripe mashed bananas  Cooked cereals Canned fruit, pineapple sauce, milk  potatoes Milkshake  Buttered or Alfredo noodles Custard  Cooked cooled vegetable Puddings, including tapioca  [place] Yogurt  Vegetable soup or alphabet soup Fruit ice, [place] ice  Gravies Whipped gelatin  Sugar, syrup, honey, jelly Junior baby desserts  Sauces:  Cheese, creamed, barbecue, tomato, white Cream  Coffee or tea Margarine   SAMPLE MENU:  LEVEL 3 Breakfast Lunch Dinner  Orange juice, 1/2 cup Oatmeal, 1/2 cup Scrambled eggs with cheese, 1/2 cup Decaffeinated tea, 1 cup Whole milk, 1 cup Non-dairy creamer, 2 Tbsp Pineapple juice, 1/2 cup  Minced beef, 3 oz Gravy, 2 Tbsp Mashed potatoes, 1/2 cup Minced fresh broccoli, 1/2 cup Applesauce, 1/2 cup Coffee, 1 cup [place], barley soup, 3/4 cup Minced Hawaiian chicken, 3 oz Mashed potatoes, 1/2 cup Cooked spinach, 1/2 cup Frozen yogurt, 1/2 cup Non-dairy creamer, 2 Tbsp      LEVEL 4 = CHOPPED DIET  -After all the foods in level 3 (soft diet) are passing through well you should advance up to more chopped foods.  -It is still important to cut these foods into small pieces and eat slowly.  Hot Foods Cold Foods  Poultry Cottage cheese  Chopped Swedish meatballs Yogurt  Meat salads (ground or flaked meat) Milk  Flaked fish (tuna) Milkshakes  Poached or scrambled eggs Soft, cold, dry cereal  Souffles and omelets Fruit juices or nectars  Cooked cereals Chopped canned fruit  Chopped [place] toast or pancakes Canned fruit cocktail  Noodles or pasta (no rice) Pudding, mousse, custard  Cooked vegetables (no frozen peas, corn, or mixed  vegetables) Green salad  Canned small sweet peas Ice cream  Creamed soup or vegetable soup Fruit ice, [place] ice  Pureed vegetable soup or alphabet soup Non-dairy creamer  Ground scalloped apples Margarine  Gravies Mayonnaise  Sauces:  Cheese, creamed, barbecue, tomato, white Ketchup  Coffee or tea Mustard   SAMPLE MENU:  LEVEL 4 Breakfast Lunch Dinner  Orange juice, 1/2 cup Oatmeal, 1/2 cup Scrambled eggs with cheese, 1/2 cup Decaffeinated tea, 1 cup Whole milk, 1 cup Non-dairy creamer, 2 Tbsp Ketchup, 1 Tbsp Margarine, 1 tsp Salt, 1/4 tsp Sugar, 2 tsp Pineapple juice, 1/2 cup Ground beef, 3 oz Gravy, 2 Tbsp Mashed potatoes, 1/2 cup Cooked spinach, 1/2 cup Applesauce, 1/2 cup Decaffeinated coffee Whole milk Non-dairy creamer, 2 Tbsp Margarine, 1 tsp Salt, 1/4 tsp Pureed [place], barley soup, 3/4 cup Barbecue chicken, 3 oz Mashed potatoes, 1/2 cup Ground fresh broccoli, 1/2 cup Frozen yogurt, 1/2 cup Decaffeinated tea, 1 cup Non-dairy creamer, 2 Tbsp Margarine, 1 tsp Salt, 1/4 tsp Sugar, 1 tsp    LEVEL 5:  REGULAR FOODS  -Foods in this group are soft, moist, regularly textured foods.   -This level includes meat and breads, which tend to be the hardest things to swallow.   -Eat very slowly, chew well and continue to avoid carbonated drinks. -most people are at this level in 6 weeks  Hot Foods Cold Foods  Baked fish or skinned Soft cheeses - cottage cheese  Souffles and omelets Cream cheese  Eggs Yogurt  Stuffed shells Milk  Spaghetti with meat sauce Milkshakes  Cooked cereal Cold dry cereals (no nuts, dried fruit, coconut)  [place] toast or pancakes Crackers  Buttered toast Fruit juices or nectars  Noodles or pasta (no rice) Canned fruit  Potatoes (all types) Ripe bananas  Soft, cooked vegetables (no corn, lima, or baked beans) Peeled, ripe, fresh fruit  Creamed soups or vegetable soup Cakes (no nuts, dried fruit, coconut)  Canned chicken noodle soup Plain  doughnuts  Gravies Ice cream  Bacon dressing Pudding, mousse, custard  Sauces:  Cheese, creamed, barbecue, tomato, white Fruit ice, [place] ice, sherbet  Decaffeinated tea or coffee Whipped gelatin  Pork chops Regular gelatin   Canned fruited gelatin molds   Sugar, syrup, honey, jam, jelly   Cream   Non-dairy   Margarine   Oil   Mayonnaise   Ketchup   Mustard   TROUBLESHOOTING IRREGULAR BOWELS  1) Avoid extremes of bowel movements (no bad constipation/diarrhea)  2) Miralax 17gm  mixed in [place]. water or juice-daily. May use BID as needed.  3) Gas-x,Phazyme, etc. as needed for gas & bloating.  4) Soft,bland diet. No spicy,greasy,fried foods.  5) Prilosec over-the-counter as needed  6) May hold gluten/wheat products from diet to see if symptoms improve.  7) May try probiotics (Align, Activa, etc) to help calm the bowels down  7) If symptoms become worse call back immediately.    If you have any questions please call our office at [place]: [phone].

## 2021-04-11 DIAGNOSIS — H40013 Open angle with borderline findings, low risk, bilateral: Secondary | ICD-10-CM | POA: Diagnosis not present

## 2021-04-11 DIAGNOSIS — E119 Type 2 diabetes mellitus without complications: Secondary | ICD-10-CM | POA: Diagnosis not present

## 2021-04-11 LAB — HM DIABETES EYE EXAM

## 2021-04-11 NOTE — Telephone Encounter (Signed)
I spoke with the patient by phone.  He was discharged on POD 1 and has had some issues with pain control.  He already spoke with Dr. Dois Davenport office earlier today with plan for medication adjustment.  He has follow-up in Dr. Dois Davenport office tomorrow and with me later this month.  To continue with high-dose Protonix 40 mg twice daily as scheduled for the time being.  He is otherwise tolerating slowly increasing diet.  No other complaints and very appreciative of the call.

## 2021-04-20 ENCOUNTER — Ambulatory Visit (INDEPENDENT_AMBULATORY_CARE_PROVIDER_SITE_OTHER): Payer: BC Managed Care – PPO | Admitting: Gastroenterology

## 2021-04-20 ENCOUNTER — Encounter: Payer: Self-pay | Admitting: Gastroenterology

## 2021-04-20 VITALS — BP 110/80 | HR 93 | Ht 74.0 in | Wt 223.5 lb

## 2021-04-20 DIAGNOSIS — K219 Gastro-esophageal reflux disease without esophagitis: Secondary | ICD-10-CM

## 2021-04-20 DIAGNOSIS — Z9889 Other specified postprocedural states: Secondary | ICD-10-CM | POA: Diagnosis not present

## 2021-04-20 NOTE — Patient Instructions (Addendum)
If you are age 46 or older, your body mass index should be between 23-30. Your Body mass index is 28.7 kg/m. If this is out of the aforementioned range listed, please consider follow up with your Primary Care Provider.  If you are age 87 or younger, your body mass index should be between 19-25. Your Body mass index is 28.7 kg/m. If this is out of the aformentioned range listed, please consider follow up with your Primary Care Provider.   Start to decrease your protonix to '40mg'$  daily for 2 weeks. Then decrease to '20mg'$  daily for one week. Then discontinue protonix.  Due to recent changes in healthcare laws, you may see the results of your imaging and laboratory studies on MyChart before your provider has had a chance to review them.  We understand that in some cases there may be results that are confusing or concerning to you. Not all laboratory results come back in the same time frame and the provider may be waiting for multiple results in order to interpret others.  Please give [place] 48 hours in order for your provider to thoroughly review all the results before contacting the office for clarification of your results.    Follow up in 6 months. Please call to schedule in 09/2021 [phone]  Thank you for choosing me and [facility].  Vito Cirigliano, D.O.

## 2021-04-20 NOTE — Progress Notes (Signed)
Chief Complaint:    Postoperative follow-up  GI History: 45 year old man with a history of depression, anxiety, HTN, HLD, follows in the GI clinic for reflux and hiatal hernia.   Has a longstanding history of reflux for the last 15-20 years, previously followed by GI in [place] prior to moving back to [facility] in 03/2020. Index sxs of HB, regurgitation. Bothersome daytime sxs but also significant +nocturnal symptoms (choking, regurgitaiton, emesis).  Has even been hospitalized with aspiration PNA requiring intubation in 04/2020 presumed 2/2 nocturnal reflux.  Exacerbated by spicy foods, chocolate, coffee, supine, lying on right side.  Regurgitation with forward flexion.  Has trialed multiple medications in the past, to include Nexium, omeprazole, Tums, carafate.  Escalating doses over the years. Currently taking Protonix 40 mg bid which generally controls symptoms. Breakthrough with any missed doses.     GERD work-up: -Reports having EGD years ago in [place]; cannot recall results - EGD (03/2020, [facility], Dr. Tora Kindred): Z-line irregular at 39 cm (path: Reflux changes without intestinal metaplasia), 1 cm HH, normal stomach and duodenum -EGD (09/2020, Dr. Bryan Lemma): 3 cm HH, Hill grade 4, fundic gland polyps -Esophagram (09/2020): Normal esophagus, normal motility.  Positive gastroesophageal reflux -GES: 01/2021: Normal - 03/2021: Underwent laparoscopic hiatal hernia pair and Nissen fundoplication     -Colonoscopy (09/2020, Dr. Bryan Lemma): 6 mm polyp (path benign), internal hemorrhoids, normal TI.  Repeat 10 years  HPI:     Patient is a 45 y.o. male presenting to the Gastroenterology Clinic for postoperative follow-up after laparoscopic hiatal hernia repair and Nissen fundoplication 2 weeks ago.  He was originally scheduled for cTIF but gastric perforation occurred during mobilization of the fundus.  This was primarily repaired intraoperatively and intraoperative plan  converted to Nissen fundoplication and surgical drain left in place.  UGI series on postop day 1 demonstrated no extravasation and was discharged to home.  Surgical drain has been subsequently removed by Dr. Redmond Pulling.    Has continued to improve gradually, with more improvement in the last day or so. Tolerating post operative diet without issue- currently on blender-consistency diet. Still taking Protonix 40 mg BID. Complete resolution of reflux sxs, and in particular nocturnal reflux/regurgitation. Bowel habits back to baseline.  Otherwise offers no complaints today.  Has lost approx 6# with post op diet.   Has f/u with Dr. Redmond Pulling tomorrow.    Review of systems:     No chest pain, no SOB, no fevers, no urinary sx   Past Medical History:  Diagnosis Date   Allergy    Anemia    Anxiety    patient states history    Cancer ([place])    Depression    history    Diabetes mellitus without complication ([place])    Elevated liver enzymes    Hyperlipidemia    Hypertension    Pneumonia     Patient's surgical history, family medical history, social history, medications and allergies were all reviewed in Epic    Current Outpatient Medications  Medication Sig Dispense Refill   Azelastine-Fluticasone 137-50 MCG/ACT SUSP Place 1-2 sprays into the nose 2 (two) times daily.     fenofibrate 160 MG tablet Take 1 tablet (160 mg total) by mouth daily. 90 tablet 3   hydrochlorothiazide (MICROZIDE) 12.5 MG capsule Take 1 capsule (12.5 mg total) by mouth daily. 90 capsule 3   levocetirizine (XYZAL ALLERGY 24HR) 5 MG tablet Take 1 tablet (5 mg total) by mouth every evening. 90 tablet 1  losartan (COZAAR) 25 MG tablet Take 25 mg by mouth daily.     metFORMIN (GLUCOPHAGE XR) 500 MG 24 hr tablet Take 1 tablet (500 mg total) by mouth daily with breakfast. 90 tablet 3   OVER THE COUNTER MEDICATION Take 1 Scoop by mouth daily. Athletic Greens     pantoprazole (PROTONIX) 40 MG tablet TAKE ONE TABLET BY MOUTH TWICE A  DAY (Patient taking differently: Take 40 mg by mouth 2 (two) times daily.) 60 tablet 2   ondansetron (ZOFRAN) 4 MG tablet Take 1 tablet (4 mg total) by mouth every 8 (eight) hours as needed for nausea or vomiting. 20 tablet 1   No current facility-administered medications for this visit.    Physical Exam:     BP 110/80   Pulse 93   Ht '6\' 2"'$  (1.88 m)   Wt 223 lb 8 oz (101.4 kg)   SpO2 97%   BMI 28.70 kg/m   GENERAL:  Pleasant male in NAD ABDOMEN: Well-healed surgical incision sites.  Nondistended, soft, SKIN:  turgor, no lesions seen Musculoskeletal:  Normal muscle tone, normal strength NEURO: Alert and oriented x 3, no focal neurologic deficits   IMPRESSION and PLAN:    1) History of hiatal hernia now s/p repair 2) History of Nissen fundoplication  - Doing well postoperatively - Will start to wean Protonix.  Decrease to 40 mg/day x2 weeks, then if symptoms still well controlled, reduce to 20 mg/day x1 week, then prn only - Has follow-up with Dr. Redmond Pulling tomorrow - Continue postoperative diet as currently doing with slow progression per postoperative protocol - RTC in 6 months or sooner as needed          [place] ,DO, FACG 04/20/2021, 10:09 AM

## 2021-04-25 DIAGNOSIS — J301 Allergic rhinitis due to pollen: Secondary | ICD-10-CM | POA: Diagnosis not present

## 2021-04-25 DIAGNOSIS — J3089 Other allergic rhinitis: Secondary | ICD-10-CM | POA: Diagnosis not present

## 2021-04-25 DIAGNOSIS — J3081 Allergic rhinitis due to animal (cat) (dog) hair and dander: Secondary | ICD-10-CM | POA: Diagnosis not present

## 2021-05-11 DIAGNOSIS — J3081 Allergic rhinitis due to animal (cat) (dog) hair and dander: Secondary | ICD-10-CM | POA: Diagnosis not present

## 2021-05-11 DIAGNOSIS — J3089 Other allergic rhinitis: Secondary | ICD-10-CM | POA: Diagnosis not present

## 2021-05-11 DIAGNOSIS — J301 Allergic rhinitis due to pollen: Secondary | ICD-10-CM | POA: Diagnosis not present

## 2021-05-17 DIAGNOSIS — J301 Allergic rhinitis due to pollen: Secondary | ICD-10-CM | POA: Diagnosis not present

## 2021-05-17 DIAGNOSIS — J3081 Allergic rhinitis due to animal (cat) (dog) hair and dander: Secondary | ICD-10-CM | POA: Diagnosis not present

## 2021-05-17 DIAGNOSIS — J3089 Other allergic rhinitis: Secondary | ICD-10-CM | POA: Diagnosis not present

## 2021-05-19 DIAGNOSIS — J3081 Allergic rhinitis due to animal (cat) (dog) hair and dander: Secondary | ICD-10-CM | POA: Diagnosis not present

## 2021-05-19 DIAGNOSIS — J301 Allergic rhinitis due to pollen: Secondary | ICD-10-CM | POA: Diagnosis not present

## 2021-05-19 DIAGNOSIS — J3089 Other allergic rhinitis: Secondary | ICD-10-CM | POA: Diagnosis not present

## 2021-05-24 DIAGNOSIS — J3081 Allergic rhinitis due to animal (cat) (dog) hair and dander: Secondary | ICD-10-CM | POA: Diagnosis not present

## 2021-05-24 DIAGNOSIS — J3089 Other allergic rhinitis: Secondary | ICD-10-CM | POA: Diagnosis not present

## 2021-05-24 DIAGNOSIS — J301 Allergic rhinitis due to pollen: Secondary | ICD-10-CM | POA: Diagnosis not present

## 2021-05-31 DIAGNOSIS — J3089 Other allergic rhinitis: Secondary | ICD-10-CM | POA: Diagnosis not present

## 2021-05-31 DIAGNOSIS — J301 Allergic rhinitis due to pollen: Secondary | ICD-10-CM | POA: Diagnosis not present

## 2021-05-31 DIAGNOSIS — J3081 Allergic rhinitis due to animal (cat) (dog) hair and dander: Secondary | ICD-10-CM | POA: Diagnosis not present

## 2021-06-02 DIAGNOSIS — J301 Allergic rhinitis due to pollen: Secondary | ICD-10-CM | POA: Diagnosis not present

## 2021-06-02 DIAGNOSIS — I10 Essential (primary) hypertension: Secondary | ICD-10-CM

## 2021-06-02 DIAGNOSIS — J3089 Other allergic rhinitis: Secondary | ICD-10-CM | POA: Diagnosis not present

## 2021-06-02 DIAGNOSIS — J3081 Allergic rhinitis due to animal (cat) (dog) hair and dander: Secondary | ICD-10-CM | POA: Diagnosis not present

## 2021-06-02 MED ORDER — HYDROCHLOROTHIAZIDE 12.5 MG PO CAPS: 12.5000 mg | ORAL_CAPSULE | Freq: Every day | ORAL | 0 refills | Status: DC

## 2021-06-02 NOTE — Telephone Encounter (Signed)
Spoke to patient after calling [facility].  [facility] after [facility] was [facility] from [facility].  Not sure what happened but was advised by [facility] that no refills were transferred.   LR 07/28/20, #90, 3 rfs LOV 12/16/20 FOV none scheduled.   Advised patient we could send in a 90 day RX and for him to schedule a TOC to another provider due to Dr Bryan Lemma leaving.  He will see which office is closer to [facility] for him and call to schedule that. Dm/cma

## 2021-06-09 DIAGNOSIS — J3089 Other allergic rhinitis: Secondary | ICD-10-CM | POA: Diagnosis not present

## 2021-06-09 DIAGNOSIS — J3081 Allergic rhinitis due to animal (cat) (dog) hair and dander: Secondary | ICD-10-CM | POA: Diagnosis not present

## 2021-06-09 DIAGNOSIS — J301 Allergic rhinitis due to pollen: Secondary | ICD-10-CM | POA: Diagnosis not present

## 2021-06-14 DIAGNOSIS — J3089 Other allergic rhinitis: Secondary | ICD-10-CM | POA: Diagnosis not present

## 2021-06-14 DIAGNOSIS — J301 Allergic rhinitis due to pollen: Secondary | ICD-10-CM | POA: Diagnosis not present

## 2021-06-14 DIAGNOSIS — J3081 Allergic rhinitis due to animal (cat) (dog) hair and dander: Secondary | ICD-10-CM | POA: Diagnosis not present

## 2021-06-21 DIAGNOSIS — J301 Allergic rhinitis due to pollen: Secondary | ICD-10-CM | POA: Diagnosis not present

## 2021-06-21 DIAGNOSIS — J3081 Allergic rhinitis due to animal (cat) (dog) hair and dander: Secondary | ICD-10-CM | POA: Diagnosis not present

## 2021-06-21 DIAGNOSIS — J3089 Other allergic rhinitis: Secondary | ICD-10-CM | POA: Diagnosis not present

## 2021-06-23 DIAGNOSIS — J3089 Other allergic rhinitis: Secondary | ICD-10-CM | POA: Diagnosis not present

## 2021-06-23 DIAGNOSIS — J301 Allergic rhinitis due to pollen: Secondary | ICD-10-CM | POA: Diagnosis not present

## 2021-06-23 DIAGNOSIS — J3081 Allergic rhinitis due to animal (cat) (dog) hair and dander: Secondary | ICD-10-CM | POA: Diagnosis not present

## 2021-06-28 DIAGNOSIS — J301 Allergic rhinitis due to pollen: Secondary | ICD-10-CM | POA: Diagnosis not present

## 2021-06-28 DIAGNOSIS — J3089 Other allergic rhinitis: Secondary | ICD-10-CM | POA: Diagnosis not present

## 2021-06-28 DIAGNOSIS — J3081 Allergic rhinitis due to animal (cat) (dog) hair and dander: Secondary | ICD-10-CM | POA: Diagnosis not present

## 2021-06-30 DIAGNOSIS — J301 Allergic rhinitis due to pollen: Secondary | ICD-10-CM | POA: Diagnosis not present

## 2021-06-30 DIAGNOSIS — J3081 Allergic rhinitis due to animal (cat) (dog) hair and dander: Secondary | ICD-10-CM | POA: Diagnosis not present

## 2021-06-30 DIAGNOSIS — J3089 Other allergic rhinitis: Secondary | ICD-10-CM | POA: Diagnosis not present

## 2021-07-05 DIAGNOSIS — J3081 Allergic rhinitis due to animal (cat) (dog) hair and dander: Secondary | ICD-10-CM | POA: Diagnosis not present

## 2021-07-05 DIAGNOSIS — J301 Allergic rhinitis due to pollen: Secondary | ICD-10-CM | POA: Diagnosis not present

## 2021-07-05 DIAGNOSIS — J3089 Other allergic rhinitis: Secondary | ICD-10-CM | POA: Diagnosis not present

## 2021-07-07 DIAGNOSIS — J301 Allergic rhinitis due to pollen: Secondary | ICD-10-CM | POA: Diagnosis not present

## 2021-07-07 DIAGNOSIS — J3081 Allergic rhinitis due to animal (cat) (dog) hair and dander: Secondary | ICD-10-CM | POA: Diagnosis not present

## 2021-07-07 DIAGNOSIS — J3089 Other allergic rhinitis: Secondary | ICD-10-CM | POA: Diagnosis not present

## 2021-07-12 DIAGNOSIS — J3081 Allergic rhinitis due to animal (cat) (dog) hair and dander: Secondary | ICD-10-CM | POA: Diagnosis not present

## 2021-07-12 DIAGNOSIS — J3089 Other allergic rhinitis: Secondary | ICD-10-CM | POA: Diagnosis not present

## 2021-07-12 DIAGNOSIS — J301 Allergic rhinitis due to pollen: Secondary | ICD-10-CM | POA: Diagnosis not present

## 2021-07-14 DIAGNOSIS — J301 Allergic rhinitis due to pollen: Secondary | ICD-10-CM | POA: Diagnosis not present

## 2021-07-14 DIAGNOSIS — J3081 Allergic rhinitis due to animal (cat) (dog) hair and dander: Secondary | ICD-10-CM | POA: Diagnosis not present

## 2021-07-14 DIAGNOSIS — J3089 Other allergic rhinitis: Secondary | ICD-10-CM | POA: Diagnosis not present

## 2021-07-19 DIAGNOSIS — J3089 Other allergic rhinitis: Secondary | ICD-10-CM | POA: Diagnosis not present

## 2021-07-19 DIAGNOSIS — J301 Allergic rhinitis due to pollen: Secondary | ICD-10-CM | POA: Diagnosis not present

## 2021-07-19 DIAGNOSIS — J3081 Allergic rhinitis due to animal (cat) (dog) hair and dander: Secondary | ICD-10-CM | POA: Diagnosis not present

## 2021-07-27 ENCOUNTER — Other Ambulatory Visit: Payer: Self-pay | Admitting: Family

## 2021-07-27 ENCOUNTER — Telehealth: Payer: Self-pay | Admitting: Family

## 2021-07-27 DIAGNOSIS — J3081 Allergic rhinitis due to animal (cat) (dog) hair and dander: Secondary | ICD-10-CM | POA: Diagnosis not present

## 2021-07-27 DIAGNOSIS — J3089 Other allergic rhinitis: Secondary | ICD-10-CM | POA: Diagnosis not present

## 2021-07-27 DIAGNOSIS — J301 Allergic rhinitis due to pollen: Secondary | ICD-10-CM | POA: Diagnosis not present

## 2021-07-27 DIAGNOSIS — E781 Pure hyperglyceridemia: Secondary | ICD-10-CM

## 2021-07-27 DIAGNOSIS — I10 Essential (primary) hypertension: Secondary | ICD-10-CM

## 2021-07-27 MED ORDER — HYDROCHLOROTHIAZIDE 12.5 MG PO CAPS: 12.5000 mg | ORAL_CAPSULE | Freq: Every day | ORAL | 0 refills | Status: DC

## 2021-07-27 MED ORDER — LOSARTAN POTASSIUM 25 MG PO TABS: 25.0000 mg | ORAL_TABLET | Freq: Every day | ORAL | 0 refills | Status: DC

## 2021-07-27 MED ORDER — METFORMIN HCL ER 500 MG PO TB24: 500.0000 mg | ORAL_TABLET | Freq: Every day | ORAL | 0 refills | Status: DC

## 2021-08-02 DIAGNOSIS — F1011 Alcohol abuse, in remission: Secondary | ICD-10-CM | POA: Insufficient documentation

## 2021-08-02 DIAGNOSIS — J3081 Allergic rhinitis due to animal (cat) (dog) hair and dander: Secondary | ICD-10-CM | POA: Diagnosis not present

## 2021-08-02 DIAGNOSIS — J309 Allergic rhinitis, unspecified: Secondary | ICD-10-CM | POA: Insufficient documentation

## 2021-08-02 DIAGNOSIS — F321 Major depressive disorder, single episode, moderate: Secondary | ICD-10-CM | POA: Insufficient documentation

## 2021-08-02 DIAGNOSIS — J301 Allergic rhinitis due to pollen: Secondary | ICD-10-CM | POA: Diagnosis not present

## 2021-08-02 DIAGNOSIS — J3089 Other allergic rhinitis: Secondary | ICD-10-CM | POA: Diagnosis not present

## 2021-08-02 DIAGNOSIS — R1313 Dysphagia, pharyngeal phase: Secondary | ICD-10-CM | POA: Insufficient documentation

## 2021-08-04 DIAGNOSIS — J3089 Other allergic rhinitis: Secondary | ICD-10-CM | POA: Diagnosis not present

## 2021-08-04 DIAGNOSIS — J301 Allergic rhinitis due to pollen: Secondary | ICD-10-CM | POA: Diagnosis not present

## 2021-08-04 DIAGNOSIS — J3081 Allergic rhinitis due to animal (cat) (dog) hair and dander: Secondary | ICD-10-CM | POA: Diagnosis not present

## 2021-08-09 DIAGNOSIS — J3081 Allergic rhinitis due to animal (cat) (dog) hair and dander: Secondary | ICD-10-CM | POA: Diagnosis not present

## 2021-08-09 DIAGNOSIS — J3089 Other allergic rhinitis: Secondary | ICD-10-CM | POA: Diagnosis not present

## 2021-08-09 DIAGNOSIS — J301 Allergic rhinitis due to pollen: Secondary | ICD-10-CM | POA: Diagnosis not present

## 2021-08-11 DIAGNOSIS — J3081 Allergic rhinitis due to animal (cat) (dog) hair and dander: Secondary | ICD-10-CM | POA: Diagnosis not present

## 2021-08-11 DIAGNOSIS — J3089 Other allergic rhinitis: Secondary | ICD-10-CM | POA: Diagnosis not present

## 2021-08-11 DIAGNOSIS — J301 Allergic rhinitis due to pollen: Secondary | ICD-10-CM | POA: Diagnosis not present

## 2021-08-15 ENCOUNTER — Encounter: Payer: Self-pay | Admitting: Family Medicine

## 2021-08-15 ENCOUNTER — Ambulatory Visit (INDEPENDENT_AMBULATORY_CARE_PROVIDER_SITE_OTHER): Payer: BC Managed Care – PPO | Admitting: Family Medicine

## 2021-08-15 ENCOUNTER — Other Ambulatory Visit: Payer: Self-pay

## 2021-08-15 VITALS — BP 116/76 | HR 62 | Temp 98.0°F | Ht 74.0 in | Wt 211.6 lb

## 2021-08-15 DIAGNOSIS — E781 Pure hyperglyceridemia: Secondary | ICD-10-CM | POA: Diagnosis not present

## 2021-08-15 DIAGNOSIS — E119 Type 2 diabetes mellitus without complications: Secondary | ICD-10-CM

## 2021-08-15 DIAGNOSIS — I10 Essential (primary) hypertension: Secondary | ICD-10-CM

## 2021-08-15 DIAGNOSIS — F1021 Alcohol dependence, in remission: Secondary | ICD-10-CM

## 2021-08-15 NOTE — Progress Notes (Signed)
Office Note 08/15/2021  CC:  Chief Complaint  Patient presents with   Establish Care    Transfer of Care from Dr.C; checks at home BP and blood sugars occasionally with no issues or concerns. Had recent eye exam at [facility] 2 months ago.    HPI:  Kyle [name] is a 45 y.o. White male who is here to establish care--transferring from Dr. Bryan [name]. He goes by Kyle [name]. Old records in EPIC/HL were reviewed prior to or during today's visit.  Most recent CPE was Jan 2022.  As presents to establish care. Biggest health issue in his life has been alcoholism.  He has been sober since December 2020.  He is speaking denied at his [place] meeting.  Fasting sugars around the 110-120 mark. Takes metformin XR 1 tab a day he is compliant with losartan 25 mg a day and HCTZ 12.5 mg a day. He is happy to say that he has no more problems with reflux since having Nissen fundoplication 4 months ago.  Past Medical History:  Diagnosis Date   Allergic rhinitis    Anxiety and depression    Cancer ([place])    Diabetes mellitus without complication ([place])    Elevated liver enzymes    GERD (gastroesophageal reflux disease)    Hyperlipidemia    Hypertension    Pneumonia     Past Surgical History:  Procedure Laterality Date   ESOPHAGOGASTRODUODENOSCOPY     2021 [facility]   HIATAL HERNIA REPAIR N/A 04/07/2021   Procedure: LAPAROSCOPIC HIATAL HERNIA REPAIR WITH NISSEN FUNDOPLICATION AND GASTRORRHAPHY;  Surgeon: Greer Pickerel, MD;  Location: [facility];  Service: General;  Laterality: N/A;   left leg surgery     SINUSOTOMY     TONSILLECTOMY AND ADENOIDECTOMY  1995   UPPER GI ENDOSCOPY  04/07/2021   Procedure: UPPER GI ENDOSCOPY;  Surgeon: Greer Pickerel, MD;  Location: [facility];  Service: General;;   WRIST FRACTURE SURGERY      Family History  Problem Relation Age of Onset   Healthy Mother    Hypertension Father    Hyperlipidemia Father    Prostate cancer Father    Bladder  Cancer Father    Birth defects Maternal Grandfather    Bone cancer Maternal Grandfather    Birth defects Paternal Grandfather    Cancer Maternal Uncle     Social History   Socioeconomic History   Marital status: Married    Spouse name: Not on file   Number of children: 2   Years of education: Not on file   Highest education level: Not on file  Occupational History   Not on file  Tobacco Use   Smoking status: Former   Smokeless tobacco: Former    Quit date: 10/29/2017  Vaping Use   Vaping Use: Former   Quit date: 11/23/2020  Substance and Sexual Activity   Alcohol use: Not Currently    Alcohol/week: 4.0 standard drinks    Types: 1 Glasses of wine, 1 Cans of beer, 1 Shots of liquor, 1 Standard drinks or equivalent per week    Comment: no alcohol in 8-37months   Drug use: Not Currently    Types: Marijuana   Sexual activity: Yes  Other Topics Concern   Not on file  Social History Narrative   Married, 1 son and 1 daughter.   Orig from [place], [place].   Educ: college grad   Occup: Freight forwarder.   Tob: none   Alc: Alcoholism, sober 2020-->went  to rehab in [place], [place]   Drugs: none   Social Determinants of Health   Financial Resource Strain: Not on file  Food Insecurity: Not on file  Transportation Needs: Not on file  Physical Activity: Not on file  Stress: Not on file  Social Connections: Not on file  Intimate Partner Violence: Not on file    Outpatient Encounter Medications as of 08/15/2021  Medication Sig   Azelastine-Fluticasone 137-50 MCG/ACT SUSP Place 1-2 sprays into the nose 2 (two) times daily.   fenofibrate 160 MG tablet Take 1 tablet (160 mg total) by mouth daily.   hydrochlorothiazide (MICROZIDE) 12.5 MG capsule Take 1 capsule (12.5 mg total) by mouth daily.   levocetirizine (XYZAL ALLERGY 24HR) 5 MG tablet Take 1 tablet (5 mg total) by mouth every evening.   losartan (COZAAR) 25 MG tablet Take 1 tablet (25 mg total) by mouth daily.    metFORMIN (GLUCOPHAGE XR) 500 MG 24 hr tablet Take 1 tablet (500 mg total) by mouth daily with breakfast.   OVER THE COUNTER MEDICATION Take 1 Scoop by mouth daily. Athletic Greens   [DISCONTINUED] acetaminophen (TYLENOL) 500 MG tablet TAKE 2 TABLETS (1,000 MG TOTAL) BY MOUTH EVERY 8 (EIGHT) HOURS FOR 5 DAYS.   [DISCONTINUED] gabapentin (NEURONTIN) 300 MG capsule Take by mouth.   [DISCONTINUED] oxyCODONE (OXY IR/ROXICODONE) 5 MG immediate release tablet Take by mouth.   [DISCONTINUED] fluticasone (FLONASE) 50 MCG/ACT nasal spray Place into both nostrils.   [DISCONTINUED] ondansetron (ZOFRAN) 4 MG tablet Take 1 tablet (4 mg total) by mouth every 8 (eight) hours as needed for nausea or vomiting.   [DISCONTINUED] pantoprazole (PROTONIX) 40 MG tablet TAKE ONE TABLET BY MOUTH TWICE A DAY (Patient taking differently: Take 40 mg by mouth 2 (two) times daily.)   No facility-administered encounter medications on file as of 08/15/2021.    Allergies  Allergen Reactions   Benzonatate Other (See Comments)    Restless legs    Promethazine Other (See Comments)    Restless legs Restless legs    ROS Review of Systems  Constitutional:  Negative for appetite change, chills, fatigue and fever.  HENT:  Negative for congestion, dental problem, ear pain and sore throat.   Eyes:  Negative for discharge, redness and visual disturbance.  Respiratory:  Negative for cough, chest tightness, shortness of breath and wheezing.   Cardiovascular:  Negative for chest pain, palpitations and leg swelling.  Gastrointestinal:  Negative for abdominal pain, blood in stool, diarrhea, nausea and vomiting.  Genitourinary:  Negative for difficulty urinating, dysuria, flank pain, frequency, hematuria and urgency.  Musculoskeletal:  Negative for arthralgias, back pain, joint swelling, myalgias and neck stiffness.  Skin:  Negative for pallor and rash.  Neurological:  Negative for dizziness, speech difficulty, weakness and  headaches.  Hematological:  Negative for adenopathy. Does not bruise/bleed easily.  Psychiatric/Behavioral:  Negative for confusion and sleep disturbance. The patient is not nervous/anxious.    PE; Blood pressure 116/76, pulse 62, temperature 98 F (36.7 C), temperature source Oral, height 6\' 2"  (1.88 m), weight 211 lb 9.6 oz (96 kg), SpO2 98 %.Body mass index is 27.17 kg/m.  Gen: Alert, well appearing.  Patient is oriented to person, place, time, and situation. AFFECT: pleasant, lucid thought and speech. CV: RRR, no m/r/g.   LUNGS: CTA bilat, nonlabored resps, good aeration in all lung fields. EXT: no clubbing or cyanosis.  no edema.  SKIN: no pallor or jaundice  Pertinent labs:  Lab Results  Component Value Date  TSH 0.887 05/02/2020   Lab Results  Component Value Date   WBC 11.9 (H) 04/08/2021   HGB 12.0 (L) 04/08/2021   HCT 37.0 (L) 04/08/2021   MCV 89.6 04/08/2021   PLT 182 04/08/2021   Lab Results  Component Value Date   FERRITIN 48 04/30/2020   Lab Results  Component Value Date   CREATININE 0.85 04/08/2021   BUN 19 04/08/2021   NA 136 04/08/2021   K 4.7 04/08/2021   CL 103 04/08/2021   CO2 26 04/08/2021   Lab Results  Component Value Date   ALT 16 04/01/2021   AST 19 04/01/2021   ALKPHOS 46 04/01/2021   BILITOT 0.6 04/01/2021   Lab Results  Component Value Date   CHOL 129 10/14/2020   Lab Results  Component Value Date   HDL 62.00 10/14/2020   Lab Results  Component Value Date   LDLCALC 56 10/14/2020   Lab Results  Component Value Date   TRIG 53.0 10/14/2020   Lab Results  Component Value Date   CHOLHDL 2 10/14/2020   Lab Results  Component Value Date   HGBA1C 6.4 (H) 04/01/2021   ASSESSMENT AND PLAN:   New patient, establishing care.  1.  Diabetes type 2.  Fasting sugars sound good on metformin XR 500 mg a day checking hemoglobin A1c today and urine microalbumin/creatinine today.  #2 hypertriglyceridemia.  Tolerating fenofibrate  160 mg a day.  We will check fasting lipids today as well as hepatic panel.  3.  Hypertension.  Well-controlled on losartan 25 mg a day and HCTZ 12.5 mg a day. Electrolytes and creatinine checked today.  4.  Alcoholism, in recovery.  Sober almost 2 years now.  Congratulated and encouraged him. History of LFT elevation well he was drinking.  Most recent testing 4 months ago showed these to be normal.  An After Visit Summary was printed and given to the patient.  Return in about 3 months (around 11/15/2021) for annual CPE (fasting).  Signed:  Crissie Sickles, MD           08/15/2021

## 2021-08-16 DIAGNOSIS — J301 Allergic rhinitis due to pollen: Secondary | ICD-10-CM | POA: Diagnosis not present

## 2021-08-16 DIAGNOSIS — J3089 Other allergic rhinitis: Secondary | ICD-10-CM | POA: Diagnosis not present

## 2021-08-16 DIAGNOSIS — J3081 Allergic rhinitis due to animal (cat) (dog) hair and dander: Secondary | ICD-10-CM | POA: Diagnosis not present

## 2021-08-16 LAB — LIPID PANEL
Cholesterol: 144 mg/dL (ref 0–200)
HDL: 72.5 mg/dL (ref >39.00)
LDL Cholesterol: 58 mg/dL (ref 0–99)
NonHDL: 71.4
Total CHOL/HDL Ratio: 2
Triglycerides: 69 mg/dL (ref 0.0–149.0)
VLDL: 13.8 mg/dL (ref 0.0–40.0)

## 2021-08-16 LAB — COMPREHENSIVE METABOLIC PANEL
ALT: 15 U/L (ref 0–53)
AST: 17 U/L (ref 0–37)
Albumin: 5.1 g/dL (ref 3.5–5.2)
Alkaline Phosphatase: 42 U/L (ref 39–117)
BUN: 13 mg/dL (ref 6–23)
CO2: 27 mEq/L (ref 19–32)
Calcium: 9.9 mg/dL (ref 8.4–10.5)
Chloride: 102 mEq/L (ref 96–112)
Creatinine, Ser: 0.9 mg/dL (ref 0.40–1.50)
GFR: 102.65 mL/min (ref >60.00)
Glucose, Bld: 82 mg/dL (ref 70–99)
Potassium: 3.6 mEq/L (ref 3.5–5.1)
Sodium: 140 mEq/L (ref 135–145)
Total Bilirubin: 0.5 mg/dL (ref 0.2–1.2)
Total Protein: 7.6 g/dL (ref 6.0–8.3)

## 2021-08-16 LAB — HEMOGLOBIN A1C: Hgb A1c MFr Bld: 6.2 % (ref 4.6–6.5)

## 2021-08-16 LAB — MICROALBUMIN / CREATININE URINE RATIO
Creatinine,U: 62 mg/dL
Microalb Creat Ratio: 1.1 mg/g (ref 0.0–30.0)
Microalb, Ur: <0.7 mg/dL (ref 0.0–1.9)

## 2021-08-23 DIAGNOSIS — J301 Allergic rhinitis due to pollen: Secondary | ICD-10-CM | POA: Diagnosis not present

## 2021-08-23 DIAGNOSIS — J3081 Allergic rhinitis due to animal (cat) (dog) hair and dander: Secondary | ICD-10-CM | POA: Diagnosis not present

## 2021-08-23 DIAGNOSIS — J3089 Other allergic rhinitis: Secondary | ICD-10-CM | POA: Diagnosis not present

## 2021-09-01 DIAGNOSIS — J3081 Allergic rhinitis due to animal (cat) (dog) hair and dander: Secondary | ICD-10-CM | POA: Diagnosis not present

## 2021-09-01 DIAGNOSIS — J3089 Other allergic rhinitis: Secondary | ICD-10-CM | POA: Diagnosis not present

## 2021-09-01 DIAGNOSIS — J301 Allergic rhinitis due to pollen: Secondary | ICD-10-CM | POA: Diagnosis not present

## 2021-09-10 ENCOUNTER — Other Ambulatory Visit: Payer: Self-pay | Admitting: Nurse Practitioner

## 2021-09-10 DIAGNOSIS — J3081 Allergic rhinitis due to animal (cat) (dog) hair and dander: Secondary | ICD-10-CM

## 2021-09-12 DIAGNOSIS — J301 Allergic rhinitis due to pollen: Secondary | ICD-10-CM | POA: Diagnosis not present

## 2021-09-12 DIAGNOSIS — J3081 Allergic rhinitis due to animal (cat) (dog) hair and dander: Secondary | ICD-10-CM | POA: Diagnosis not present

## 2021-09-12 DIAGNOSIS — J3089 Other allergic rhinitis: Secondary | ICD-10-CM | POA: Diagnosis not present

## 2021-09-20 DIAGNOSIS — J301 Allergic rhinitis due to pollen: Secondary | ICD-10-CM | POA: Diagnosis not present

## 2021-09-20 DIAGNOSIS — J3089 Other allergic rhinitis: Secondary | ICD-10-CM | POA: Diagnosis not present

## 2021-09-20 DIAGNOSIS — J3081 Allergic rhinitis due to animal (cat) (dog) hair and dander: Secondary | ICD-10-CM | POA: Diagnosis not present

## 2021-09-27 ENCOUNTER — Encounter: Payer: Self-pay | Admitting: Family Medicine

## 2021-09-28 ENCOUNTER — Other Ambulatory Visit: Payer: Self-pay

## 2021-09-28 DIAGNOSIS — E781 Pure hyperglyceridemia: Secondary | ICD-10-CM

## 2021-09-28 MED ORDER — FENOFIBRATE 160 MG PO TABS: 160.0000 mg | ORAL_TABLET | Freq: Every day | ORAL | 0 refills | Status: DC

## 2021-09-29 DIAGNOSIS — J3081 Allergic rhinitis due to animal (cat) (dog) hair and dander: Secondary | ICD-10-CM | POA: Diagnosis not present

## 2021-09-29 DIAGNOSIS — J301 Allergic rhinitis due to pollen: Secondary | ICD-10-CM | POA: Diagnosis not present

## 2021-09-29 DIAGNOSIS — J3089 Other allergic rhinitis: Secondary | ICD-10-CM | POA: Diagnosis not present

## 2021-10-06 DIAGNOSIS — J301 Allergic rhinitis due to pollen: Secondary | ICD-10-CM | POA: Diagnosis not present

## 2021-10-06 DIAGNOSIS — J3089 Other allergic rhinitis: Secondary | ICD-10-CM | POA: Diagnosis not present

## 2021-10-06 DIAGNOSIS — J3081 Allergic rhinitis due to animal (cat) (dog) hair and dander: Secondary | ICD-10-CM | POA: Diagnosis not present

## 2021-10-11 DIAGNOSIS — J3089 Other allergic rhinitis: Secondary | ICD-10-CM | POA: Diagnosis not present

## 2021-10-11 DIAGNOSIS — J301 Allergic rhinitis due to pollen: Secondary | ICD-10-CM | POA: Diagnosis not present

## 2021-10-11 DIAGNOSIS — J3081 Allergic rhinitis due to animal (cat) (dog) hair and dander: Secondary | ICD-10-CM | POA: Diagnosis not present

## 2021-10-13 ENCOUNTER — Telehealth: Payer: BC Managed Care – PPO | Admitting: Family Medicine

## 2021-10-13 ENCOUNTER — Telehealth: Payer: BC Managed Care – PPO | Admitting: Physician Assistant

## 2021-10-13 DIAGNOSIS — U071 COVID-19: Secondary | ICD-10-CM

## 2021-10-13 MED ORDER — PAXLOVID (300/100) 20 X 150 MG & 10 X 100MG PO TBPK: 1.0000 | ORAL_TABLET | Freq: Two times a day (BID) | ORAL | 0 refills | Status: AC

## 2021-10-13 NOTE — Progress Notes (Signed)
Virtual Visit Consent   Kyle [name], you are scheduled for a virtual visit with a [facility] provider today.     Just as with appointments in the office, your consent must be obtained to participate.  Your consent will be active for this visit and any virtual visit you may have with one of our providers in the next 365 days.     If you have a MyChart account, a copy of this consent can be sent to you electronically.  All virtual visits are billed to your insurance company just like a traditional visit in the office.    As this is a virtual visit, video technology does not allow for your provider to perform a traditional examination.  This may limit your provider's ability to fully assess your condition.  If your provider identifies any concerns that need to be evaluated in person or the need to arrange testing (such as labs, EKG, etc.), we will make arrangements to do so.     Although advances in technology are sophisticated, we cannot ensure that it will always work on either your end or our end.  If the connection with a video visit is poor, the visit may have to be switched to a telephone visit.  With either a video or telephone visit, we are not always able to ensure that we have a secure connection.     I need to obtain your verbal consent now.   Are you willing to proceed with your visit today?    Mithcell Schumpert has provided verbal consent on 10/13/2021 for a virtual visit (video or telephone).   Perlie Mayo, NP   Date: 10/13/2021 8:47 AM   Virtual Visit via Video Note   I, Perlie Mayo, connected with  Kyle [name]  ([id number], 06/19/1976) on 10/13/21 at  8:45 AM EST by a video-enabled telemedicine application and verified that I am speaking with the correct person using two identifiers.  Location: Patient: Virtual Visit Location Patient: Home Provider: Virtual Visit Location Provider: Home Office   I discussed the limitations of evaluation and management by telemedicine and  the availability of in person appointments. The patient expressed understanding and agreed to proceed.    History of Present Illness: Kyle [name] is a 46 y.o. who identifies as a male who was assigned male at birth, and is being seen today for COVID positive- today on home test Tuesday night started with chills and hot and sweating- thought it was allergy shot reaction. Wednesday developed a headache and sore throat.  Denies chest pain, shortness of breath, ear pain.  Vaccine x2 and booster.  Flu vaccine  Problems:  Patient Active Problem List   Diagnosis Date Noted   Allergic rhinitis 08/02/2021   History of alcohol abuse 08/02/2021   Moderate major depression, single episode ([place]) 08/02/2021   Pharyngeal dysphagia 08/02/2021   S/P laparoscopic fundoplication 49/44/9675   Hyperlipidemia 05/10/2020   Type 2 diabetes mellitus with hyperglycemia, without long-term current use of insulin ([place]) 05/10/2020   Recovering alcoholic ([place]) 91/63/8466   Depression with anxiety 05/10/2020   Sepsis ([place]) 05/01/2020   Essential hypertension 05/01/2020   Multifocal pneumonia    Acute respiratory failure with hypoxia ([place]) 04/30/2020   GERD (gastroesophageal reflux disease) 11/26/2010    Allergies:  Allergies  Allergen Reactions   Benzonatate Other (See Comments)    Restless legs    Promethazine Other (See Comments)    Restless legs Restless legs   Medications:  Current Outpatient  Medications:    Azelastine-Fluticasone 137-50 MCG/ACT SUSP, Place 1-2 sprays into the nose 2 (two) times daily., Disp: , Rfl:    fenofibrate 160 MG tablet, Take 1 tablet (160 mg total) by mouth daily., Disp: 90 tablet, Rfl: 0   hydrochlorothiazide (MICROZIDE) 12.5 MG capsule, Take 1 capsule (12.5 mg total) by mouth daily., Disp: 90 capsule, Rfl: 0   levocetirizine (XYZAL) 5 MG tablet, TAKE 1 TABLET BY MOUTH EVERY DAY IN THE EVENING, Disp: 30 tablet, Rfl: 5   losartan (COZAAR) 25 MG tablet, Take 1 tablet (25 mg  total) by mouth daily., Disp: 90 tablet, Rfl: 0   metFORMIN (GLUCOPHAGE XR) 500 MG 24 hr tablet, Take 1 tablet (500 mg total) by mouth daily with breakfast., Disp: 90 tablet, Rfl: 0   OVER THE COUNTER MEDICATION, Take 1 Scoop by mouth daily. Athletic Greens, Disp: , Rfl:   Observations/Objective: Patient is well-developed, well-nourished in no acute distress.  Resting comfortably  at home.  Head is normocephalic, atraumatic.  No labored breathing.  Speech is clear and coherent with logical content.  Patient is alert and oriented at baseline.    Assessment and Plan: 1. COVID-19 + HT S&S consistent with COVID Desires antiviral due to HX  Tx provided Info on AVS as well    Reviewed side effects, risks and benefits of medication.    Patient acknowledged agreement and understanding of the plan.    - nirmatrelvir & ritonavir (PAXLOVID, 300/100,) 20 x 150 MG & 10 x 100MG  TBPK; Take 1 tablet by mouth 2 (two) times daily for 5 days.  Dispense: 10 tablet; Refill: 0    Follow Up Instructions: I discussed the assessment and treatment plan with the patient. The patient was provided an opportunity to ask questions and all were answered. The patient agreed with the plan and demonstrated an understanding of the instructions.  A copy of instructions were sent to the patient via MyChart unless otherwise noted below.   The patient was advised to call back or seek an in-person evaluation if the symptoms worsen or if the condition fails to improve as anticipated.  Time:  I spent 13 minutes with the patient via telehealth technology discussing the above problems/concerns.    Perlie Mayo, NP

## 2021-10-13 NOTE — Progress Notes (Signed)
Based on the information that you have shared in the e-Visit Questionnaire, we recommend that you convert this visit to a video visit in order for the provider to discuss full COVID treatments including antivirals giving your risk factors, as we cannot prescribe these without a face-to-face (video) visit.   If you convert to a video visit, we will bill your insurance (similar to an office visit) and you will not be charged for this e-Visit. You will be able to stay at home and speak with the first available [facility] advanced practice provider. The link to do a video visit is in the drop down Menu tab of your Welcome screen in [place].  If you do not want to pursue antivirals, let me know, and we will continue via e-visit.

## 2021-10-13 NOTE — Patient Instructions (Addendum)
Nirmatrelvir; Ritonavir Tablets What is this medication? NIRMATRELVIR; RITONAVIR (NIR ma TREL vir; ri TOE na veer) treats mild to moderate COVID-19. It may help people who are at high risk of developing severe illness. This medication works by limiting the spread of the virus in your body. The FDA has allowed the emergency use of this medication. This medicine may be used for other purposes; ask your health care provider or pharmacist if you have questions. COMMON BRAND NAME(S): PAXLOVID What should I tell my care team before I take this medication? They need to know if you have any of these conditions: Any allergies Any serious illness Kidney disease Liver disease An unusual or allergic reaction to nirmatrelvir, ritonavir, other medications, foods, dyes, or preservatives Pregnant or trying to get pregnant Breast-feeding How should I use this medication? This product contains 2 different medications that are packaged together. For the standard dose, take 2 pink tablets of nirmatrelvir with 1 white tablet of ritonavir (3 tablets total) by mouth with water twice daily. Talk to your care team if you have kidney disease. You may need a different dose. Swallow the tablets whole. You can take it with or without food. If it upsets your stomach, take it with food. Take all of this medication unless your care team tells you to stop it early. Keep taking it even if you think you are better. Talk to your care team about the use of this medication in children. While it may be prescribed for children as young as 12 years for selected conditions, precautions do apply. Overdosage: If you think you have taken too much of this medicine contact a poison control center or emergency room at once. NOTE: This medicine is only for you. Do not share this medicine with others. What if I miss a dose? If you miss a dose, take it as soon as you can unless it is more than 8 hours late. If it is more than 8 hours late, skip  the missed dose. Take the next dose at the normal time. Do not take extra or 2 doses at the same time to make up for the missed dose. What may interact with this medication? Do not take this medication with any of the following medications: Alfuzosin Certain medications for anxiety or sleep like midazolam, triazolam Certain medications for cancer like apalutamide, enzalutamide Certain medications for cholesterol like lovastatin, simvastatin Certain medications for irregular heart beat like amiodarone, dronedarone, flecainide, propafenone, quinidine Certain medications for pain like meperidine, piroxicam Certain medications for psychotic disorders like clozapine, lurasidone, pimozide Certain medications for seizures like carbamazepine, phenobarbital, phenytoin Colchicine Eletriptan Eplerenone Ergot alkaloids like dihydroergotamine, ergonovine, ergotamine, methylergonovine Finerenone Flibanserin Ivabradine Lomitapide Naloxegol Ranolazine Rifampin Sildenafil Silodosin St. John's Wort Tolvaptan Ubrogepant Voclosporin This medication may also interact with the following medications: Bedaquiline Birth control pills Bosentan Certain antibiotics like erythromycin or clarithromycin Certain medications for blood pressure like amlodipine, diltiazem, felodipine, nicardipine, nifedipine Certain medications for cancer like abemaciclib, ceritinib, dasatinib, encorafenib, ibrutinib, ivosidenib, neratinib, nilotinib, venetoclax, vinblastine, vincristine Certain medications for cholesterol like atorvastatin, rosuvastatin Certain medications for depression like bupropion, trazodone Certain medications for fungal infections like isavuconazonium, itraconazole, ketoconazole, voriconazole Certain medications for hepatitis C like elbasvir; grazoprevir, dasabuvir; ombitasvir; paritaprevir; ritonavir, glecaprevir; pibrentasvir, sofosbuvir; velpatasvir; voxilaprevir Certain medications for HIV or  AIDS Certain medications for irregular heartbeat like lidocaine Certain medications that treat or prevent blood clots like rivaroxaban, warfarin Digoxin Fentanyl Medications that lower your chance of fighting infection like cyclosporine, sirolimus, tacrolimus Methadone Quetiapine  Rifabutin Salmeterol Steroid medications like betamethasone, budesonide, ciclesonide, dexamethasone, fluticasone, methylprednisone, mometasone, triamcinolone This list may not describe all possible interactions. Give your health care provider a list of all the medicines, herbs, non-prescription drugs, or dietary supplements you use. Also tell them if you smoke, drink alcohol, or use illegal drugs. Some items may interact with your medicine. What should I watch for while using this medication? Your condition will be monitored carefully while you are receiving this medication. Visit your care team for regular checkups. Tell your care team if your symptoms do not start to get better or if they get worse. If you have untreated HIV infection, this medication may lead to some HIV medications not working as well in the future. Birth control may not work properly while you are taking this medication. Talk to your care team about using an extra method of birth control. What side effects may I notice from receiving this medication? Side effects that you should report to your care team as soon as possible: Allergic reactions--skin rash, itching, hives, swelling of the face, lips, tongue, or throat Liver injury--right upper belly pain, loss of appetite, nausea, light-colored stool, dark yellow or brown urine, yellowing skin or eyes, unusual weakness or fatigue Redness, blistering, peeling, or loosening of the skin, including inside the mouth Side effects that usually do not require medical attention (report these to your care team if they continue or are bothersome): Change in taste Diarrhea General discomfort and  fatigue Increase in blood pressure Muscle pain Nausea Stomach pain This list may not describe all possible side effects. Call your doctor for medical advice about side effects. You may report side effects to FDA at 1-800-FDA-1088. Where should I keep my medication? Keep out of the reach of children and pets. Store at room temperature between 20 and 25 degrees C (68 and 77 degrees F). Get rid of any unused medication after the expiration date. To get rid of medications that are no longer needed or have expired: Take the medication to a medication take-back program. Check with your pharmacy or law enforcement to find a location. If you cannot return the medication, check the label or package insert to see if the medication should be thrown out in the garbage or flushed down the toilet. If you are not sure, ask your care team. If it is safe to put it in the trash, take the medication out of the container. Mix the medication with cat litter, dirt, coffee grounds, or other unwanted substance. Seal the mixture in a bag or container. Put it in the trash. NOTE: This sheet is a summary. It may not cover all possible information. If you have questions about this medicine, talk to your doctor, pharmacist, or health care provider.  2022 Elsevier/Gold Standard (2021-06-13 00:00:00)   COVID-19: What to Do if You Are Sick If you test positive and are an older adult or someone who is at high risk of getting very sick from COVID-19, treatment may be available. Contact a healthcare provider right away after a positive test to determine if you are eligible, even if your symptoms are mild right now. You can also visit a Test to Treat location and, if eligible, receive a prescription from a provider. Don't delay: Treatment must be started within the first few days to be effective. If you have a fever, cough, or other symptoms, you might have COVID-19. Most people have mild illness and are able to recover at home. If  you are sick:  Keep track of your symptoms. If you have an emergency warning sign (including trouble breathing), call 911. Steps to help prevent the spread of COVID-19 if you are sick If you are sick with COVID-19 or think you might have COVID-19, follow the steps below to care for yourself and to help protect other people in your home and community. Stay home except to get medical care Stay home. Most people with COVID-19 have mild illness and can recover at home without medical care. Do not leave your home, except to get medical care. Do not visit public areas and do not go to places where you are unable to wear a mask. Take care of yourself. Get rest and stay hydrated. Take over-the-counter medicines, such as acetaminophen, to help you feel better. Stay in touch with your doctor. Call before you get medical care. Be sure to get care if you have trouble breathing, or have any other emergency warning signs, or if you think it is an emergency. Avoid public transportation, ride-sharing, or taxis if possible. Get tested If you have symptoms of COVID-19, get tested. While waiting for test results, stay away from others, including staying apart from those living in your household. Get tested as soon as possible after your symptoms start. Treatments may be available for people with COVID-19 who are at risk for becoming very sick. Don't delay: Treatment must be started early to be effective--some treatments must begin within 5 days of your first symptoms. Contact your healthcare provider right away if your test result is positive to determine if you are eligible. Self-tests are one of several options for testing for the virus that causes COVID-19 and may be more convenient than laboratory-based tests and point-of-care tests. Ask your healthcare provider or your local health department if you need help interpreting your test results. You can visit your state, tribal, local, and territorial health department's  website to look for the latest local information on testing sites. Separate yourself from other people As much as possible, stay in a specific room and away from other people and pets in your home. If possible, you should use a separate bathroom. If you need to be around other people or animals in or outside of the home, wear a well-fitting mask. Tell your close contacts that they may have been exposed to COVID-19. An infected person can spread COVID-19 starting 48 hours (or 2 days) before the person has any symptoms or tests positive. By letting your close contacts know they may have been exposed to COVID-19, you are helping to protect everyone. See COVID-19 and Animals if you have questions about pets. If you are diagnosed with COVID-19, someone from the health department may call you. Answer the call to slow the spread. Monitor your symptoms Symptoms of COVID-19 include fever, cough, or other symptoms. Follow care instructions from your healthcare provider and local health department. Your local health authorities may give instructions on checking your symptoms and reporting information. When to seek emergency medical attention Look for emergency warning signs* for COVID-19. If someone is showing any of these signs, seek emergency medical care immediately: Trouble breathing Persistent pain or pressure in the chest New confusion Inability to wake or stay awake Pale, gray, or blue-colored skin, lips, or nail beds, depending on skin tone *This list is not all possible symptoms. Please call your medical provider for any other symptoms that are severe or concerning to you. Call 911 or call ahead to your local emergency facility: Notify the operator that  you are seeking care for someone who has or may have COVID-19. Call ahead before visiting your doctor Call ahead. Many medical visits for routine care are being postponed or done by phone or telemedicine. If you have a medical appointment that  cannot be postponed, call your doctor's office, and tell them you have or may have COVID-19. This will help the office protect themselves and other patients. If you are sick, wear a well-fitting mask You should wear a mask if you must be around other people or animals, including pets (even at home). Wear a mask with the best fit, protection, and comfort for you. You don't need to wear the mask if you are alone. If you can't put on a mask (because of trouble breathing, for example), cover your coughs and sneezes in some other way. Try to stay at least 6 feet away from other people. This will help protect the people around you. Masks should not be placed on young children under age 21 years, anyone who has trouble breathing, or anyone who is not able to remove the mask without help. Cover your coughs and sneezes Cover your mouth and nose with a tissue when you cough or sneeze. Throw away used tissues in a lined trash can. Immediately wash your hands with soap and water for at least 20 seconds. If soap and water are not available, clean your hands with an alcohol-based hand sanitizer that contains at least 60% alcohol. Clean your hands often Wash your hands often with soap and water for at least 20 seconds. This is especially important after blowing your nose, coughing, or sneezing; going to the bathroom; and before eating or preparing food. Use hand sanitizer if soap and water are not available. Use an alcohol-based hand sanitizer with at least 60% alcohol, covering all surfaces of your hands and rubbing them together until they feel dry. Soap and water are the best option, especially if hands are visibly dirty. Avoid touching your eyes, nose, and mouth with unwashed hands. Handwashing Tips Avoid sharing personal household items Do not share dishes, drinking glasses, cups, eating utensils, towels, or bedding with other people in your home. Wash these items thoroughly after using them with soap and  water or put in the dishwasher. Clean surfaces in your home regularly Clean and disinfect high-touch surfaces (for example, doorknobs, tables, handles, light switches, and countertops) in your "sick room" and bathroom. In shared spaces, you should clean and disinfect surfaces and items after each use by the person who is ill. If you are sick and cannot clean, a caregiver or other person should only clean and disinfect the area around you (such as your bedroom and bathroom) on an as needed basis. Your caregiver/other person should wait as long as possible (at least several hours) and wear a mask before entering, cleaning, and disinfecting shared spaces that you use. Clean and disinfect areas that may have blood, stool, or body fluids on them. Use household cleaners and disinfectants. Clean visible dirty surfaces with household cleaners containing soap or detergent. Then, use a household disinfectant. Use a product from [employer] List N: Disinfectants for Coronavirus (TLXBW-62). Be sure to follow the instructions on the label to ensure safe and effective use of the product. Many products recommend keeping the surface wet with a disinfectant for a certain period of time (look at "contact time" on the product label). You may also need to wear personal protective equipment, such as gloves, depending on the directions on the product label.  Immediately after disinfecting, wash your hands with soap and water for 20 seconds. For completed guidance on cleaning and disinfecting your home, visit Complete Disinfection Guidance. Take steps to improve ventilation at home Improve ventilation (air flow) at home to help prevent from spreading COVID-19 to other people in your household. Clear out COVID-19 virus particles in the air by opening windows, using air filters, and turning on fans in your home. Use this interactive tool to learn how to improve air flow in your home. When you can be around others after being sick  with COVID-19 Deciding when you can be around others is different for different situations. Find out when you can safely end home isolation. For any additional questions about your care, contact your healthcare provider or state or local health department. 12/14/2020 Content source: [facility] ([facility]), Division of Viral Diseases This information is not intended to replace advice given to you by your health care provider. Make sure you discuss any questions you have with your health care provider. Document Revised: 06/03/2021 Document Reviewed: 06/03/2021 Elsevier Patient Education  [place].

## 2021-10-20 DIAGNOSIS — J3089 Other allergic rhinitis: Secondary | ICD-10-CM | POA: Diagnosis not present

## 2021-10-20 DIAGNOSIS — J3081 Allergic rhinitis due to animal (cat) (dog) hair and dander: Secondary | ICD-10-CM | POA: Diagnosis not present

## 2021-10-20 DIAGNOSIS — J301 Allergic rhinitis due to pollen: Secondary | ICD-10-CM | POA: Diagnosis not present

## 2021-10-22 ENCOUNTER — Other Ambulatory Visit: Payer: Self-pay | Admitting: Family

## 2021-10-22 DIAGNOSIS — E781 Pure hyperglyceridemia: Secondary | ICD-10-CM

## 2021-10-27 DIAGNOSIS — J3089 Other allergic rhinitis: Secondary | ICD-10-CM | POA: Diagnosis not present

## 2021-10-27 DIAGNOSIS — J3081 Allergic rhinitis due to animal (cat) (dog) hair and dander: Secondary | ICD-10-CM | POA: Diagnosis not present

## 2021-10-27 DIAGNOSIS — J301 Allergic rhinitis due to pollen: Secondary | ICD-10-CM | POA: Diagnosis not present

## 2021-11-03 DIAGNOSIS — J301 Allergic rhinitis due to pollen: Secondary | ICD-10-CM | POA: Diagnosis not present

## 2021-11-03 DIAGNOSIS — J3081 Allergic rhinitis due to animal (cat) (dog) hair and dander: Secondary | ICD-10-CM | POA: Diagnosis not present

## 2021-11-03 DIAGNOSIS — J3089 Other allergic rhinitis: Secondary | ICD-10-CM | POA: Diagnosis not present

## 2021-11-08 ENCOUNTER — Telehealth: Payer: BC Managed Care – PPO | Admitting: Family Medicine

## 2021-11-08 DIAGNOSIS — J069 Acute upper respiratory infection, unspecified: Secondary | ICD-10-CM | POA: Diagnosis not present

## 2021-11-08 NOTE — Progress Notes (Signed)

## 2021-11-10 DIAGNOSIS — J3089 Other allergic rhinitis: Secondary | ICD-10-CM | POA: Diagnosis not present

## 2021-11-10 DIAGNOSIS — J3081 Allergic rhinitis due to animal (cat) (dog) hair and dander: Secondary | ICD-10-CM | POA: Diagnosis not present

## 2021-11-10 DIAGNOSIS — J301 Allergic rhinitis due to pollen: Secondary | ICD-10-CM | POA: Diagnosis not present

## 2021-11-11 ENCOUNTER — Other Ambulatory Visit: Payer: Self-pay

## 2021-11-11 ENCOUNTER — Emergency Department (INDEPENDENT_AMBULATORY_CARE_PROVIDER_SITE_OTHER)
Admission: RE | Admit: 2021-11-11 | Discharge: 2021-11-11 | Disposition: A | Discharge status: Home / Self Care | Payer: BC Managed Care – PPO | Source: Ambulatory Visit

## 2021-11-11 ENCOUNTER — Telehealth: Payer: Self-pay

## 2021-11-11 VITALS — BP 117/76 | HR 70 | Temp 98.5°F | Resp 18 | Ht 74.0 in | Wt 196.0 lb

## 2021-11-11 DIAGNOSIS — J069 Acute upper respiratory infection, unspecified: Secondary | ICD-10-CM | POA: Diagnosis not present

## 2021-11-11 LAB — POCT RAPID STREP A (OFFICE): Rapid Strep A Screen: NEGATIVE

## 2021-11-11 LAB — POCT INFLUENZA A/B
Influenza A, POC: NEGATIVE
Influenza B, POC: NEGATIVE

## 2021-11-11 LAB — POC SARS CORONAVIRUS 2 AG -  ED: SARS Coronavirus 2 Ag: NEGATIVE

## 2021-11-11 MED ORDER — PSEUDOEPH-BROMPHEN-DM 30-2-10 MG/5ML PO SYRP: 10.0000 mL | ORAL_SOLUTION | Freq: Four times a day (QID) | ORAL | 0 refills | Status: DC | PRN

## 2021-11-11 NOTE — Discharge Instructions (Signed)
Strep, COVID, and flu negative. Rest and keep hydrated. Take medication as prescribed to help with cough and congestion. Recommend nasal saline spray or rinses to help with congestion as well. Follow-up with PCP if no improvement in a week. Go to the ER if develop difficulty breathing.

## 2021-11-11 NOTE — ED Provider Notes (Signed)
[name]    CSN: [id number] Arrival date & time: 11/11/21  1012      History   Chief Complaint Chief Complaint  Patient presents with   Sore Throat    Sore throat, nasal congestion, cough, and chest congestion. X4 days    HPI Kyle [name] is a 46 y.o. male.   Patient presents feeling unwell for the past few days. He reports mild headache, nasal congestion, sore throat, hoarse voice, post-nasal drainage, and productive cough. He denies fever, body aches, n/v/d, or difficulty breathing. He denies known recent sick contacts. The patient did a virtual visit earlier in the week and was advised to take Mucinex. He has not taken anything (including Mucinex) for his symptoms apart from his routine allergy medications.   The history is provided by the patient.  Sore Throat Associated symptoms include headaches. Pertinent negatives include no shortness of breath.   Past Medical History:  Diagnosis Date   Alcoholism in recovery [facility])    quit 08/2019 (rehab)   Allergic rhinitis    Anxiety and depression    Hx of-->when in recovery period from alcoholism   Diabetes mellitus without complication ([place])    Elevated liver enzymes    hx of, when he was drinking ETOH heavily   GERD (gastroesophageal reflux disease)    resolved with Nissen/HH surgery 2022   History of pneumonia    Hyperlipidemia    Hypertension     Patient Active Problem List   Diagnosis Date Noted   Allergic rhinitis 08/02/2021   History of alcohol abuse 08/02/2021   Moderate major depression, single episode ([place]) 08/02/2021   Pharyngeal dysphagia 08/02/2021   S/P laparoscopic fundoplication 32/99/2426   Hyperlipidemia 05/10/2020   Type 2 diabetes mellitus with hyperglycemia, without long-term current use of insulin ([place]) 05/10/2020   Recovering alcoholic ([place]) 83/41/9622   Depression with anxiety 05/10/2020   Sepsis ([place]) 05/01/2020   Essential hypertension 05/01/2020   Multifocal pneumonia     Acute respiratory failure with hypoxia ([place]) 04/30/2020   GERD (gastroesophageal reflux disease) 11/26/2010    Past Surgical History:  Procedure Laterality Date   ESOPHAGOGASTRODUODENOSCOPY     2021 [facility]   HIATAL HERNIA REPAIR N/A 04/07/2021   Procedure: LAPAROSCOPIC HIATAL HERNIA REPAIR WITH NISSEN FUNDOPLICATION AND GASTRORRHAPHY;  Surgeon: Greer Pickerel, MD;  Location: [facility];  Service: General;  Laterality: N/A;   left leg surgery     SINUSOTOMY     TONSILLECTOMY AND ADENOIDECTOMY  1995   UPPER GI ENDOSCOPY  04/07/2021   Procedure: UPPER GI ENDOSCOPY;  Surgeon: Greer Pickerel, MD;  Location: [facility];  Service: General;;   WRIST FRACTURE SURGERY         Home Medications    Prior to Admission medications   Medication Sig Start Date End Date Taking? Authorizing Provider  Azelastine-Fluticasone 137-50 MCG/ACT SUSP Place 1-2 sprays into the nose 2 (two) times daily. 03/29/21  Yes [provider]  brompheniramine-pseudoephedrine-DM 30-2-10 MG/5ML syrup Take 10 mLs by mouth 4 (four) times daily as needed. 11/11/21  Yes Tavion Senkbeil L, PA  fenofibrate 160 MG tablet Take 1 tablet (160 mg total) by mouth daily. 09/28/21  Yes McGowen, Adrian Blackwater, MD  hydrochlorothiazide (MICROZIDE) 12.5 MG capsule Take 1 capsule (12.5 mg total) by mouth daily. 07/27/21  Yes [name] B, FNP  levocetirizine (XYZAL) 5 MG tablet TAKE 1 TABLET BY MOUTH EVERY DAY IN THE EVENING 09/12/21  Yes McGowen, Adrian Blackwater, MD  losartan (  COZAAR) 25 MG tablet TAKE 1 TABLET (25 MG TOTAL) BY MOUTH DAILY. 10/24/21  Yes McGowen, Adrian Blackwater, MD  metFORMIN (GLUCOPHAGE-XR) 500 MG 24 hr tablet TAKE 1 TABLET BY MOUTH EVERY DAY WITH BREAKFAST 10/24/21  Yes McGowen, Adrian Blackwater, MD  OVER THE COUNTER MEDICATION Take 1 Scoop by mouth daily. Athletic Greens   Yes [provider]    Family History Family History  Problem Relation Age of Onset   Healthy Mother    Hypertension Father    Hyperlipidemia Father     Prostate cancer Father    Bladder Cancer Father    Birth defects Maternal Grandfather    Bone cancer Maternal Grandfather    Birth defects Paternal Grandfather    Cancer Maternal Uncle     Social History Social History   Tobacco Use   Smoking status: Former   Smokeless tobacco: Former    Quit date: 10/29/2017  Vaping Use   Vaping Use: Former   Quit date: 11/23/2020  Substance Use Topics   Alcohol use: Not Currently    Alcohol/week: 4.0 standard drinks    Types: 1 Glasses of wine, 1 Cans of beer, 1 Shots of liquor, 1 Standard drinks or equivalent per week    Comment: no alcohol in 8-11months   Drug use: Not Currently    Types: Marijuana     Allergies   Benzonatate and Promethazine   Review of Systems Review of Systems  Constitutional:  Negative for fever.  HENT:  Positive for congestion, postnasal drip, sore throat and voice change (hoarse). Negative for drooling, ear pain and trouble swallowing.   Eyes:  Negative for redness.  Respiratory:  Positive for cough. Negative for chest tightness, shortness of breath and wheezing.   Gastrointestinal:  Negative for diarrhea, nausea and vomiting.  Musculoskeletal:  Negative for myalgias.  Skin:  Negative for rash.  Neurological:  Positive for headaches. Negative for dizziness.    Physical Exam Triage Vital Signs ED Triage Vitals  Enc Vitals Group     BP 11/11/21 1028 117/76     Pulse Rate 11/11/21 1028 70     Resp 11/11/21 1028 18     Temp 11/11/21 1028 98.5 F (36.9 C)     Temp Source 11/11/21 1028 Oral     SpO2 11/11/21 1028 99 %     Weight 11/11/21 1026 196 lb (88.9 kg)     Height 11/11/21 1026 6\' 2"  (1.88 m)     Head Circumference --      Peak Flow --      Pain Score 11/11/21 1025 10     Pain Loc --      Pain Edu? --      Excl. in [place]? --    No data found.  Updated Vital Signs BP 117/76 (BP Location: Left Arm)    Pulse 70    Temp 98.5 F (36.9 C) (Oral)    Resp 18    Ht 6\' 2"  (1.88 m)    Wt 196 lb (88.9 kg)     SpO2 99%    BMI 25.16 kg/m   Visual Acuity Right Eye Distance:   Left Eye Distance:   Bilateral Distance:    Right Eye Near:   Left Eye Near:    Bilateral Near:     Physical Exam Vitals and nursing note reviewed.  Constitutional:      General: He is not in acute distress. HENT:     Head: Normocephalic and atraumatic.  Nose: Congestion present. No rhinorrhea.     Mouth/Throat:     Mouth: Mucous membranes are moist.     Pharynx: Oropharynx is clear. No oropharyngeal exudate or posterior oropharyngeal erythema.  Eyes:     Conjunctiva/sclera: Conjunctivae normal.     Pupils: Pupils are equal, round, and reactive to light.  Cardiovascular:     Rate and Rhythm: Normal rate and regular rhythm.     Heart sounds: Normal heart sounds.  Pulmonary:     Effort: Pulmonary effort is normal.     Breath sounds: Normal breath sounds.  Musculoskeletal:     Cervical back: Normal range of motion.  Lymphadenopathy:     Cervical: No cervical adenopathy.  Skin:    Findings: No rash.  Neurological:     Mental Status: He is alert.     Gait: Gait normal.  Psychiatric:        Mood and Affect: Mood normal.     UC Treatments / Results  Labs (all labs ordered are listed, but only abnormal results are displayed) Labs Reviewed  POCT RAPID STREP A (OFFICE)  POCT INFLUENZA A/B  POC SARS CORONAVIRUS 2 AG -  ED    EKG   Radiology No results found.  Procedures Procedures (including critical care time)  Medications Ordered in UC Medications - No data to display  Initial Impression / Assessment and Plan / UC Course  I have reviewed the triage vital signs and the nursing notes.  Pertinent labs & imaging results that were available during my care of the patient were reviewed by me and considered in my medical decision making (see chart for details).     Strep, covid, flu neg. S/s consistent with viral URI. Sx tx and reassurance. Return/ER precautions discussed.   E/M: 1 acute  uncomplicated illness, 3 data (flu, strep, covid), moderate risk due to prescription management  Final Clinical Impressions(s) / UC Diagnoses   Final diagnoses:  Viral URI with cough     Discharge Instructions      Strep, COVID, and flu negative. Rest and keep hydrated. Take medication as prescribed to help with cough and congestion. Recommend nasal saline spray or rinses to help with congestion as well. Follow-up with PCP if no improvement in a week. Go to the ER if develop difficulty breathing.     ED Prescriptions     Medication Sig Dispense Auth. Provider   brompheniramine-pseudoephedrine-DM 30-2-10 MG/5ML syrup Take 10 mLs by mouth 4 (four) times daily as needed. 120 mL Abner Greenspan, Holman Bonsignore L, PA      PDMP not reviewed this encounter.   Delsa Sale, [place] 11/11/21 1115

## 2021-11-11 NOTE — ED Triage Notes (Signed)
Pt states that he has a sore throat, nasal congestion, cough, and chest congestion. X4 days  Pt states that he is vaccinated for covid. Pt states that he has had flu vaccine..  Pt states that he recently had covid in January. Negative covid test on 2/14.

## 2021-11-22 DIAGNOSIS — J3081 Allergic rhinitis due to animal (cat) (dog) hair and dander: Secondary | ICD-10-CM | POA: Diagnosis not present

## 2021-11-22 DIAGNOSIS — J301 Allergic rhinitis due to pollen: Secondary | ICD-10-CM | POA: Diagnosis not present

## 2021-11-22 DIAGNOSIS — J3089 Other allergic rhinitis: Secondary | ICD-10-CM | POA: Diagnosis not present

## 2021-11-28 DIAGNOSIS — J3081 Allergic rhinitis due to animal (cat) (dog) hair and dander: Secondary | ICD-10-CM | POA: Diagnosis not present

## 2021-11-28 DIAGNOSIS — J301 Allergic rhinitis due to pollen: Secondary | ICD-10-CM | POA: Diagnosis not present

## 2021-11-28 DIAGNOSIS — J3089 Other allergic rhinitis: Secondary | ICD-10-CM | POA: Diagnosis not present

## 2021-12-08 DIAGNOSIS — J301 Allergic rhinitis due to pollen: Secondary | ICD-10-CM | POA: Diagnosis not present

## 2021-12-08 DIAGNOSIS — J3081 Allergic rhinitis due to animal (cat) (dog) hair and dander: Secondary | ICD-10-CM | POA: Diagnosis not present

## 2021-12-08 DIAGNOSIS — J3089 Other allergic rhinitis: Secondary | ICD-10-CM | POA: Diagnosis not present

## 2021-12-14 DIAGNOSIS — J301 Allergic rhinitis due to pollen: Secondary | ICD-10-CM | POA: Diagnosis not present

## 2021-12-14 DIAGNOSIS — J3081 Allergic rhinitis due to animal (cat) (dog) hair and dander: Secondary | ICD-10-CM | POA: Diagnosis not present

## 2021-12-14 DIAGNOSIS — J3089 Other allergic rhinitis: Secondary | ICD-10-CM | POA: Diagnosis not present

## 2021-12-22 DIAGNOSIS — J3089 Other allergic rhinitis: Secondary | ICD-10-CM | POA: Diagnosis not present

## 2021-12-22 DIAGNOSIS — J3081 Allergic rhinitis due to animal (cat) (dog) hair and dander: Secondary | ICD-10-CM | POA: Diagnosis not present

## 2021-12-22 DIAGNOSIS — J301 Allergic rhinitis due to pollen: Secondary | ICD-10-CM | POA: Diagnosis not present

## 2021-12-26 ENCOUNTER — Other Ambulatory Visit: Payer: Self-pay | Admitting: Family Medicine

## 2021-12-26 DIAGNOSIS — E781 Pure hyperglyceridemia: Secondary | ICD-10-CM

## 2022-01-05 DIAGNOSIS — J301 Allergic rhinitis due to pollen: Secondary | ICD-10-CM | POA: Diagnosis not present

## 2022-01-05 DIAGNOSIS — J3081 Allergic rhinitis due to animal (cat) (dog) hair and dander: Secondary | ICD-10-CM | POA: Diagnosis not present

## 2022-01-05 DIAGNOSIS — J3089 Other allergic rhinitis: Secondary | ICD-10-CM | POA: Diagnosis not present

## 2022-01-16 ENCOUNTER — Other Ambulatory Visit: Payer: Self-pay | Admitting: Family Medicine

## 2022-01-19 ENCOUNTER — Other Ambulatory Visit: Payer: Self-pay | Admitting: Family Medicine

## 2022-01-19 DIAGNOSIS — J3081 Allergic rhinitis due to animal (cat) (dog) hair and dander: Secondary | ICD-10-CM | POA: Diagnosis not present

## 2022-01-19 DIAGNOSIS — J3089 Other allergic rhinitis: Secondary | ICD-10-CM | POA: Diagnosis not present

## 2022-01-19 DIAGNOSIS — E781 Pure hyperglyceridemia: Secondary | ICD-10-CM

## 2022-01-19 DIAGNOSIS — J301 Allergic rhinitis due to pollen: Secondary | ICD-10-CM | POA: Diagnosis not present

## 2022-01-20 ENCOUNTER — Other Ambulatory Visit: Payer: Self-pay | Admitting: Family Medicine

## 2022-01-20 DIAGNOSIS — E781 Pure hyperglyceridemia: Secondary | ICD-10-CM

## 2022-01-24 ENCOUNTER — Other Ambulatory Visit: Payer: Self-pay | Admitting: Family Medicine

## 2022-01-24 DIAGNOSIS — E781 Pure hyperglyceridemia: Secondary | ICD-10-CM

## 2022-01-25 ENCOUNTER — Encounter: Payer: Self-pay | Admitting: Family Medicine

## 2022-01-26 NOTE — Telephone Encounter (Signed)
Pt has upcoming appt tomorrow, 5/5. ?

## 2022-01-27 ENCOUNTER — Ambulatory Visit (INDEPENDENT_AMBULATORY_CARE_PROVIDER_SITE_OTHER): Payer: BC Managed Care – PPO | Admitting: Family Medicine

## 2022-01-27 ENCOUNTER — Encounter: Payer: Self-pay | Admitting: Family Medicine

## 2022-01-27 VITALS — BP 105/68 | HR 66 | Temp 98.3°F | Ht 74.0 in | Wt 202.4 lb

## 2022-01-27 DIAGNOSIS — E119 Type 2 diabetes mellitus without complications: Secondary | ICD-10-CM | POA: Diagnosis not present

## 2022-01-27 DIAGNOSIS — I10 Essential (primary) hypertension: Secondary | ICD-10-CM

## 2022-01-27 DIAGNOSIS — E781 Pure hyperglyceridemia: Secondary | ICD-10-CM | POA: Diagnosis not present

## 2022-01-27 LAB — POCT GLYCOSYLATED HEMOGLOBIN (HGB A1C)
HbA1c POC (<> result, manual entry): 5.3 % (ref 4.0–5.6)
HbA1c, POC (controlled diabetic range): 5.3 % (ref 0.0–7.0)
HbA1c, POC (prediabetic range): 5.3 % — AB (ref 5.7–6.4)
Hemoglobin A1C: 5.3 % (ref 4.0–5.6)

## 2022-01-27 MED ORDER — LOSARTAN POTASSIUM 25 MG PO TABS: 25.0000 mg | ORAL_TABLET | Freq: Every day | ORAL | 1 refills | Status: DC

## 2022-01-27 MED ORDER — FENOFIBRATE 160 MG PO TABS: 160.0000 mg | ORAL_TABLET | Freq: Every day | ORAL | 1 refills | Status: DC

## 2022-01-27 MED ORDER — METFORMIN HCL ER 500 MG PO TB24: ORAL_TABLET | ORAL | 1 refills | Status: DC

## 2022-01-27 MED ORDER — HYDROCHLOROTHIAZIDE 12.5 MG PO CAPS: 12.5000 mg | ORAL_CAPSULE | Freq: Every day | ORAL | 1 refills | Status: DC

## 2022-01-27 NOTE — Progress Notes (Signed)
OFFICE VISIT ? ?01/27/2022 ? ?CC:  ?Chief Complaint  ?Patient presents with  ? Hypertension  ? Hyperlipidemia  ?  Pt is not fasting  ? Diabetes  ? ?Patient is a 46 y.o. male who presents for 56-monthfollow-up diabetes, hypertension, and hypertriglyceridemia. ?A/P as of last visit: ?"1.  Diabetes type 2.  Fasting sugars sound good on metformin XR 500 mg a day checking hemoglobin A1c today and urine microalbumin/creatinine today. ?  ?#2 hypertriglyceridemia.  Tolerating fenofibrate 160 mg a day.  We will check fasting lipids today as well as hepatic panel. ? ?3.  Hypertension.  Well-controlled on losartan 25 mg a day and HCTZ 12.5 mg a day. ?Electrolytes and creatinine checked today. ? ?4.  Alcoholism, in recovery.  Sober almost 2 years now.  Congratulated and encouraged him. ?History of LFT elevation well he was drinking.  Most recent testing 4 months ago showed these to be normal." ? ?INTERIM HX: ?At last visit his hemoglobin A1c was 6.2%.  All other labs normal. ? ?#Is feeling well. ?He exercises several times a day--resistance training, walking, yoga. ?He says he eats a fairly good diet, peanuts and cashews are his weak spot. ? ?No home glucose or blood pressure monitoring. ? ?ROS :no fevers, no CP, no SOB, no wheezing, no cough, no dizziness, no HAs, no rashes, no melena/hematochezia.  No polyuria or polydipsia.  No myalgias or arthralgias.  No focal weakness, paresthesias, or tremors.  No acute vision or hearing abnormalities.  No dysuria or unusual/new urinary urgency or frequency.  No recent changes in lower legs. ?No n/v/d or abd pain.  No palpitations.   ? ? ?Past Medical History:  ?Diagnosis Date  ? Alcoholism in recovery ([facility]   ? quit 08/2019 (rehab)  ? Allergic rhinitis   ? Anxiety and depression   ? Hx of-->when in recovery period from alcoholism  ? Diabetes mellitus without complication ([place]   ? Elevated liver enzymes   ? hx of, when he was drinking ETOH heavily  ? GERD (gastroesophageal reflux disease)    ? resolved with Nissen/HH surgery 2022  ? History of pneumonia   ? Hyperlipidemia   ? Hypertension   ? ? ?Past Surgical History:  ?Procedure Laterality Date  ? ESOPHAGOGASTRODUODENOSCOPY    ? 2021 [facility] ? HIATAL HERNIA REPAIR N/A 04/07/2021  ? Procedure: LAPAROSCOPIC HIATAL HERNIA REPAIR WITH NISSEN FUNDOPLICATION AND GASTRORRHAPHY;  Surgeon: WGreer Pickerel MD;  Location: [facility];  Service: General;  Laterality: N/A;  ? left leg surgery    ? SINUSOTOMY    ? TONSILLECTOMY AND ADENOIDECTOMY  1995  ? UPPER GI ENDOSCOPY  04/07/2021  ? Procedure: UPPER GI ENDOSCOPY;  Surgeon: WGreer Pickerel MD;  Location: [facility];  Service: General;;  ? WRIST FRACTURE SURGERY    ? ? ?Outpatient Medications Prior to Visit  ?Medication Sig Dispense Refill  ? Azelastine-Fluticasone 137-50 MCG/ACT SUSP Place 1-2 sprays into the nose 2 (two) times daily.    ? brompheniramine-pseudoephedrine-DM 30-2-10 MG/5ML syrup Take 10 mLs by mouth 4 (four) times daily as needed. 120 mL 0  ? fluticasone (FLONASE) 50 MCG/ACT nasal spray Place 2 sprays into both nostrils daily.    ? levocetirizine (XYZAL) 5 MG tablet TAKE 1 TABLET BY MOUTH EVERY DAY IN THE EVENING 30 tablet 5  ? OVER THE COUNTER MEDICATION Take 1 Scoop by mouth daily. Athletic Greens    ? fenofibrate 160 MG tablet TAKE 1 TABLET BY MOUTH EVERY DAY 30 tablet  0  ? hydrochlorothiazide (MICROZIDE) 12.5 MG capsule Take 1 capsule (12.5 mg total) by mouth daily. 90 capsule 0  ? losartan (COZAAR) 25 MG tablet Take 1 tablet (25 mg total) by mouth daily. OFFICE VISIT NEEDED FOR FURTHER REFILLS 30 tablet 0  ? metFORMIN (GLUCOPHAGE-XR) 500 MG 24 hr tablet TAKE 1 TABLET BY MOUTH EVERY DAY WITH BREAKFAST 30 tablet 0  ? ?No facility-administered medications prior to visit.  ? ? ?Allergies  ?Allergen Reactions  ? Benzonatate Other (See Comments)  ?  Restless legs   ? Promethazine Other (See Comments)  ?  Restless legs ?Restless legs  ? ? ?ROS ?As per HPI ? ?PE: ? ?  01/27/2022  ?  2:37  PM 11/11/2021  ? 10:28 AM 11/11/2021  ? 10:26 AM  ?Vitals with BMI  ?Height '6\' 2"'$   '6\' 2"'$   ?Weight 202 lbs 6 oz  196 lbs  ?BMI 25.98  25.15  ?Systolic 782 956   ?Diastolic 68 76   ?Pulse 66 70   ? ? ? ?Physical Exam ? ?Gen: Alert, well appearing.  Patient is oriented to person, place, time, and situation. ?AFFECT: pleasant, lucid thought and speech. ?Foot exam - no swelling, tenderness or skin or vascular lesions. Color and temperature is normal. Sensation is intact. Peripheral pulses are palpable. Toenails are normal. ? ? ?LABS:  ?Last CBC ?Lab Results  ?Component Value Date  ? WBC 11.9 (H) 04/08/2021  ? HGB 12.0 (L) 04/08/2021  ? HCT 37.0 (L) 04/08/2021  ? MCV 89.6 04/08/2021  ? MCH 29.1 04/08/2021  ? RDW 13.5 04/08/2021  ? PLT 182 04/08/2021  ? ?Lab Results  ?Component Value Date  ? FERRITIN 48 04/30/2020  ? ?Last metabolic panel ?Lab Results  ?Component Value Date  ? GLUCOSE 82 08/15/2021  ? NA 140 08/15/2021  ? K 3.6 08/15/2021  ? CL 102 08/15/2021  ? CO2 27 08/15/2021  ? BUN 13 08/15/2021  ? CREATININE 0.90 08/15/2021  ? GFRNONAA >60 04/08/2021  ? CALCIUM 9.9 08/15/2021  ? PROT 7.6 08/15/2021  ? ALBUMIN 5.1 08/15/2021  ? BILITOT 0.5 08/15/2021  ? ALKPHOS 42 08/15/2021  ? AST 17 08/15/2021  ? ALT 15 08/15/2021  ? ANIONGAP 7 04/08/2021  ? ?Last lipids ?Lab Results  ?Component Value Date  ? CHOL 144 08/15/2021  ? HDL 72.50 08/15/2021  ? [place] 58 08/15/2021  ? LDLDIRECT 83.0 05/12/2020  ? TRIG 69.0 08/15/2021  ? CHOLHDL 2 08/15/2021  ? ?Last hemoglobin A1c ?Lab Results  ?Component Value Date  ? HGBA1C 5.3 01/27/2022  ? HGBA1C 5.3 01/27/2022  ? HGBA1C 5.3 (A) 01/27/2022  ? HGBA1C 5.3 01/27/2022  ? ?Last thyroid functions ?Lab Results  ?Component Value Date  ? TSH 0.887 05/02/2020  ? ?IMPRESSION AND PLAN: ? ?#1 type 2 diabetes without complication. ?Point-of-care hemoglobin A1c today is 5.3%. ?He prefers to stay on the metformin so we will continue 500 mg XR daily. ?Metabolic panel ordered today. ? ?#2 essential  hypertension, well controlled on HCTZ 12.5/day and losartan 25 mg/day. ?Electrolytes and creatinine today. ? ?#3 hypertriglyceridemia. ?Diet and exercise habits are good.  Continue fenofibrate 160 mg a day. ?Recheck lipid panel 6 months. ?Hepatic panel today ? ?An After Visit Summary was printed and given to the patient. ? ?FOLLOW UP: Return in about 6 months (around 07/30/2022) for annual CPE (fasting). ? ?Signed:  Crissie Sickles, MD           01/27/2022 ? ?

## 2022-01-28 LAB — COMPREHENSIVE METABOLIC PANEL WITH GFR
AG Ratio: 2 (calc) (ref 1.0–2.5)
ALT: 12 U/L (ref 9–46)
AST: 13 U/L (ref 10–40)
Albumin: 4.5 g/dL (ref 3.6–5.1)
Alkaline phosphatase (APISO): 36 U/L (ref 36–130)
BUN/Creatinine Ratio: 25 (calc) — ABNORMAL HIGH (ref 6–22)
BUN: 27 mg/dL — ABNORMAL HIGH (ref 7–25)
CO2: 25 mmol/L (ref 20–32)
Calcium: 9.7 mg/dL (ref 8.6–10.3)
Chloride: 100 mmol/L (ref 98–110)
Creat: 1.07 mg/dL (ref 0.60–1.29)
Globulin: 2.3 g/dL (ref 1.9–3.7)
Glucose, Bld: 86 mg/dL (ref 65–99)
Potassium: 3.9 mmol/L (ref 3.5–5.3)
Sodium: 138 mmol/L (ref 135–146)
Total Bilirubin: 0.4 mg/dL (ref 0.2–1.2)
Total Protein: 6.8 g/dL (ref 6.1–8.1)

## 2022-02-02 DIAGNOSIS — J301 Allergic rhinitis due to pollen: Secondary | ICD-10-CM | POA: Diagnosis not present

## 2022-02-02 DIAGNOSIS — J3089 Other allergic rhinitis: Secondary | ICD-10-CM | POA: Diagnosis not present

## 2022-02-02 DIAGNOSIS — J3081 Allergic rhinitis due to animal (cat) (dog) hair and dander: Secondary | ICD-10-CM | POA: Diagnosis not present

## 2022-02-06 ENCOUNTER — Encounter: Payer: Self-pay | Admitting: Family Medicine

## 2022-02-16 DIAGNOSIS — J3081 Allergic rhinitis due to animal (cat) (dog) hair and dander: Secondary | ICD-10-CM | POA: Diagnosis not present

## 2022-02-16 DIAGNOSIS — J301 Allergic rhinitis due to pollen: Secondary | ICD-10-CM | POA: Diagnosis not present

## 2022-02-16 DIAGNOSIS — J3089 Other allergic rhinitis: Secondary | ICD-10-CM | POA: Diagnosis not present

## 2022-03-02 DIAGNOSIS — J3081 Allergic rhinitis due to animal (cat) (dog) hair and dander: Secondary | ICD-10-CM | POA: Diagnosis not present

## 2022-03-02 DIAGNOSIS — J301 Allergic rhinitis due to pollen: Secondary | ICD-10-CM | POA: Diagnosis not present

## 2022-03-02 DIAGNOSIS — J3089 Other allergic rhinitis: Secondary | ICD-10-CM | POA: Diagnosis not present

## 2022-03-11 ENCOUNTER — Other Ambulatory Visit: Payer: Self-pay | Admitting: Family Medicine

## 2022-03-11 DIAGNOSIS — J3081 Allergic rhinitis due to animal (cat) (dog) hair and dander: Secondary | ICD-10-CM

## 2022-03-16 DIAGNOSIS — J3089 Other allergic rhinitis: Secondary | ICD-10-CM | POA: Diagnosis not present

## 2022-03-16 DIAGNOSIS — J301 Allergic rhinitis due to pollen: Secondary | ICD-10-CM | POA: Diagnosis not present

## 2022-03-16 DIAGNOSIS — J3081 Allergic rhinitis due to animal (cat) (dog) hair and dander: Secondary | ICD-10-CM | POA: Diagnosis not present

## 2022-03-26 IMAGING — RF DG UGI W SINGLE CM
7 of 10 series · 13 of 19 positions shown · IV contrast (omnipaque)
Comparison: 10/08/2020 esophagram

CLINICAL DATA: Status post laparoscopic fundoplication.

EXAM:
WATER SOLUBLE UPPER GI SERIES
TECHNIQUE: Single-column upper GI series was performed using water soluble
contrast.
CONTRAST:  Omnipaque 300

[Series 1: t abdomen supine · 0.15mm/px · 1 of 1 slices shown]
[im 1/1]
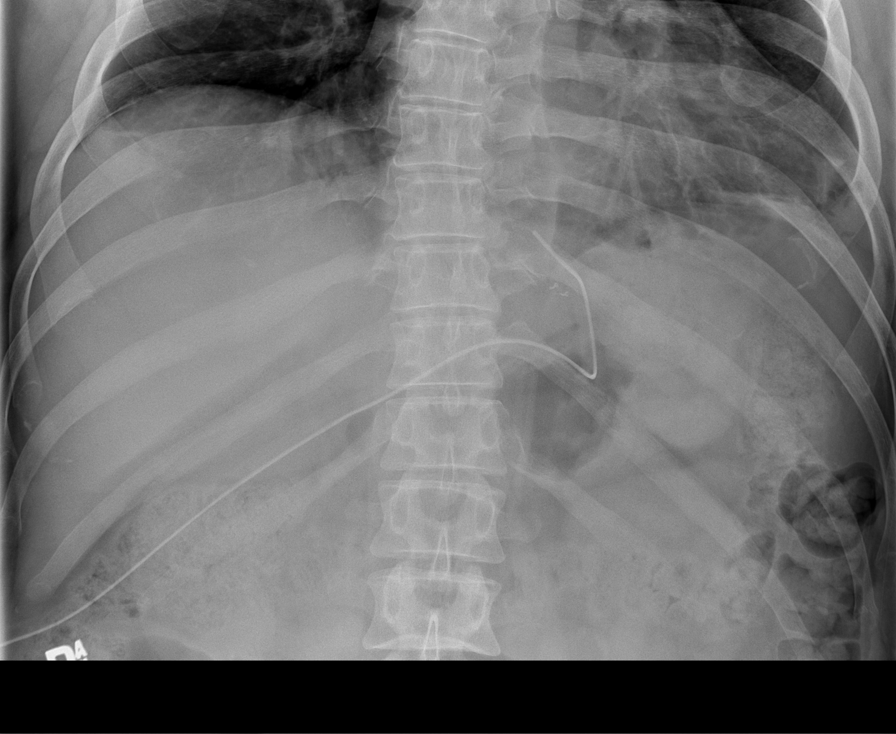

[Series 3: cp_standard · 0.53mm/px · 3 of 222 frames shown (1 of 6)]
[frame 34/222]
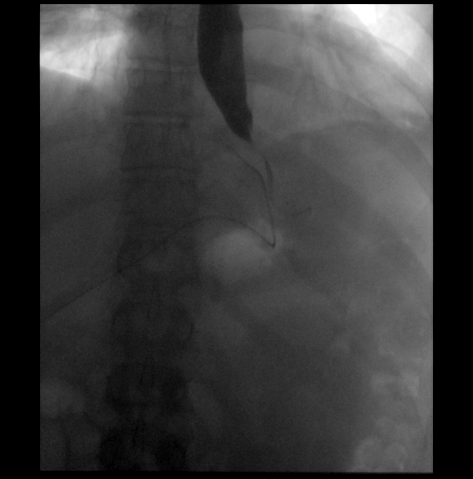
[frame 45/222]
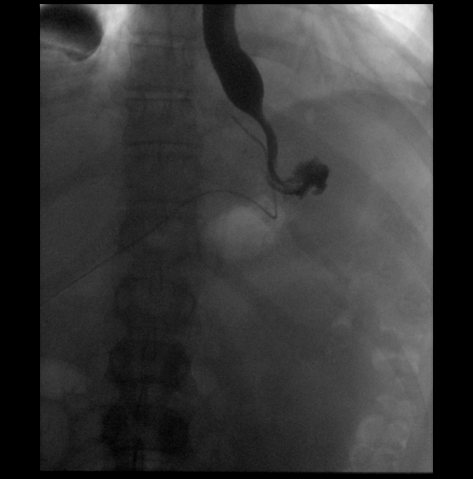
[frame 189/222]
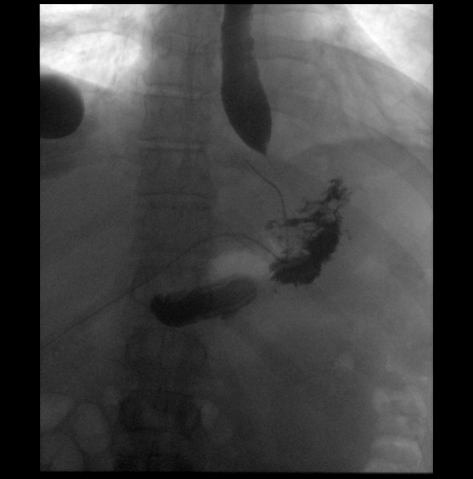

[Series 4: cp_standard · 0.53mm/px · 3 of 465 frames shown (2 of 6)]
[frame 70/465]
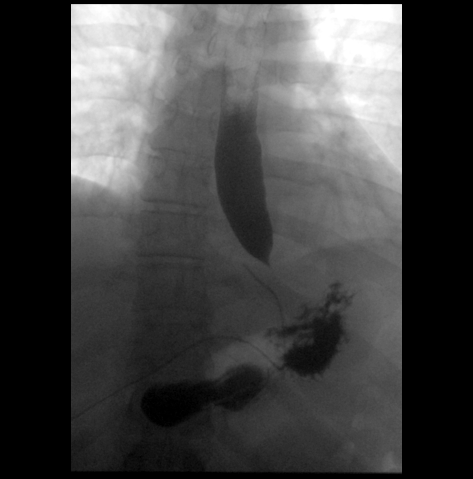
[frame 233/465]
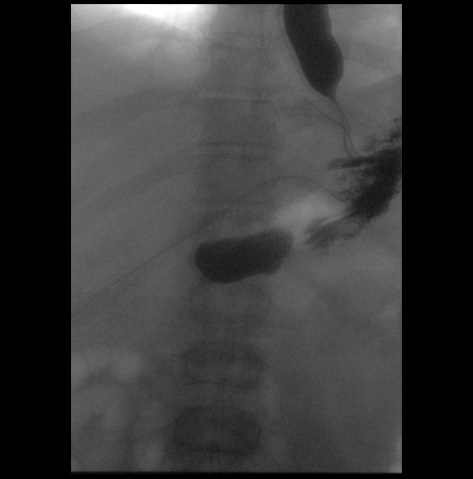
[frame 396/465]
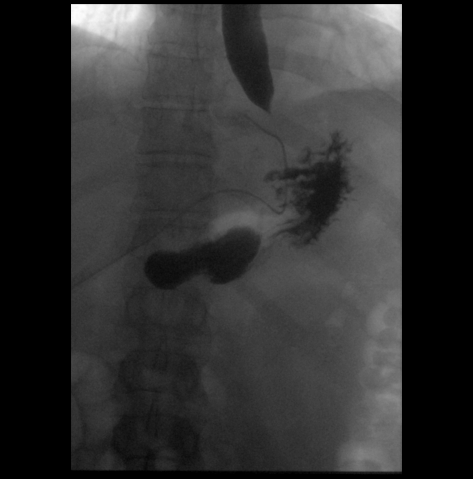

[Series 5: cp_standard · 0.53mm/px · 3 of 135 frames shown (3 of 6)]
[frame 21/135]
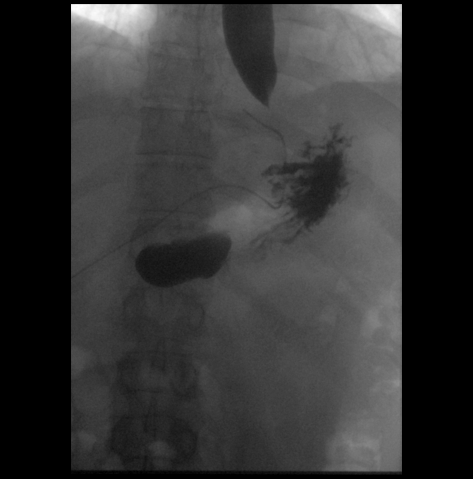
[frame 68/135]
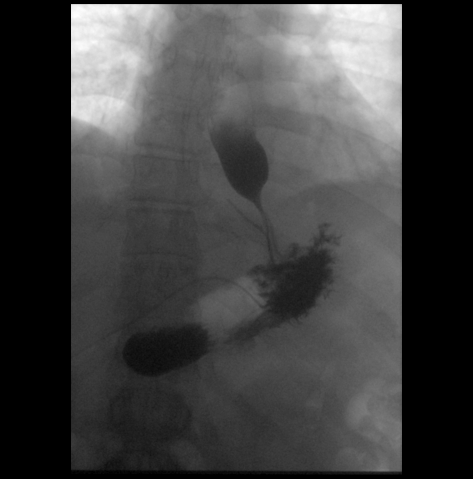
[frame 115/135]
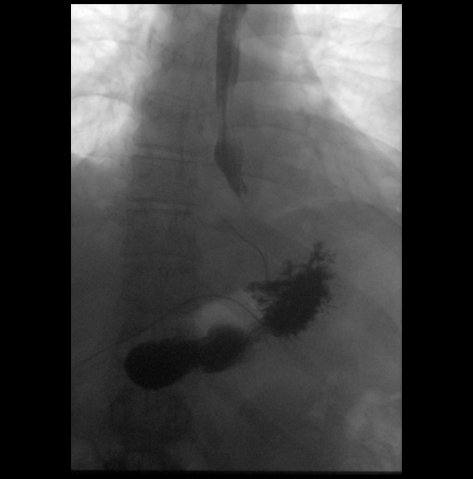

[Series 7: cp_standard · 0.27mm/px · 1 of 1 slices shown (4 of 6)]
[im 1/1]
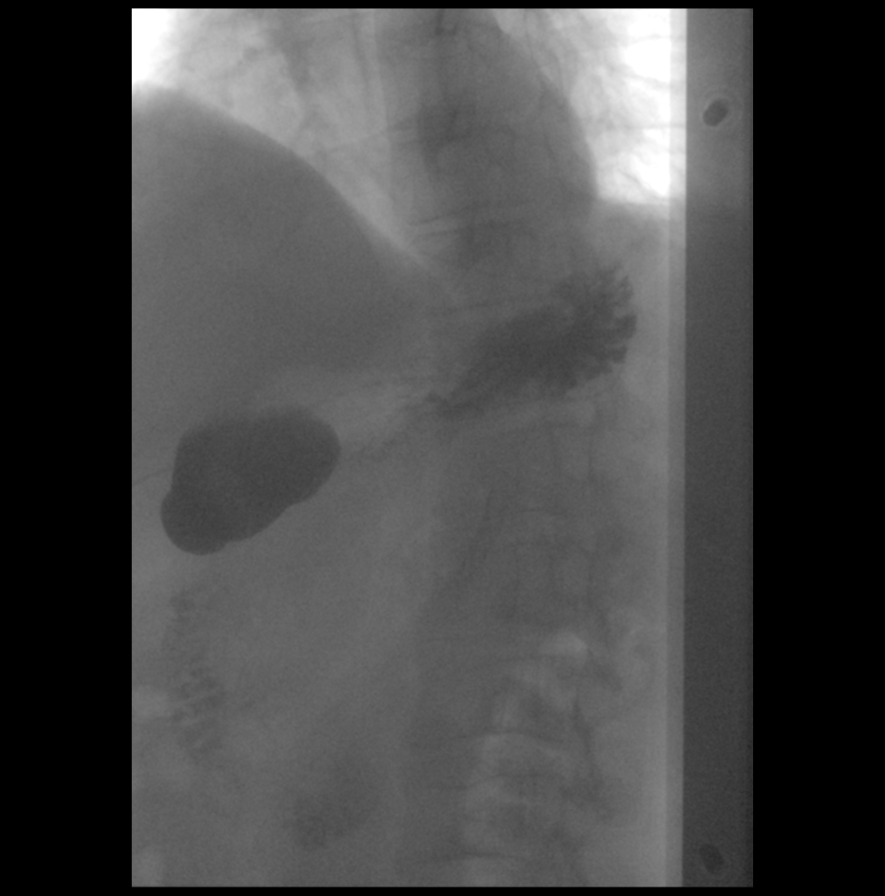

[Series 8: cp_standard · 0.27mm/px · 1 of 1 slices shown (5 of 6)]
[im 1/1]
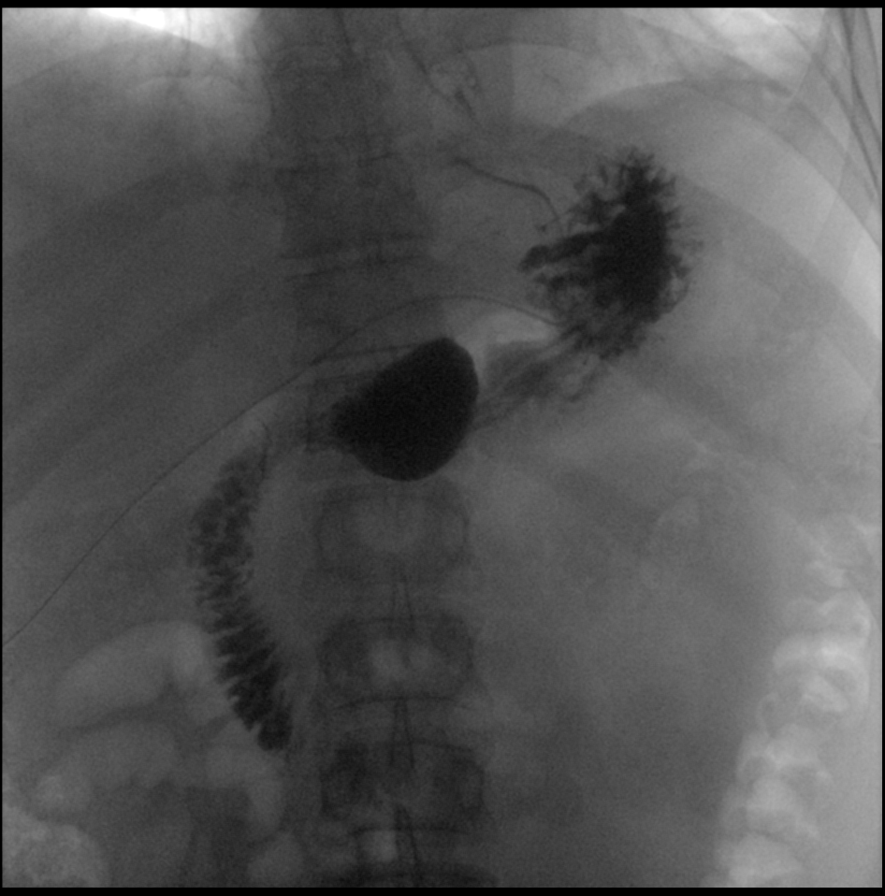

[Series 10: cp_standard · 0.26mm/px · 1 of 1 slices shown (6 of 6)]
[im 1/1]
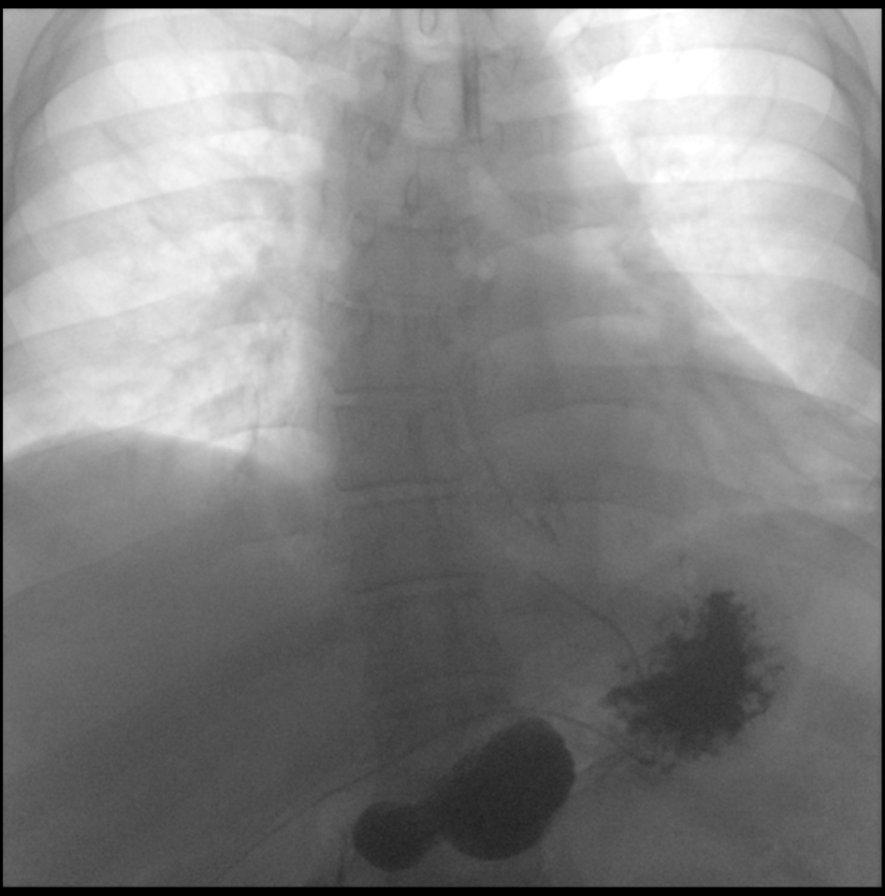

[13 of 19 positions shown; findings below may reference images not displayed]

FLUOROSCOPY TIME:  Fluoroscopy Time:  2 minutes and 6 seconds

Radiation Exposure Index (if provided by the fluoroscopic device):
60.4 mGy

Number of Acquired Spot Images: 0
FINDINGS: Preprocedure scout film demonstrates right hemidiaphragm elevation.
Surgical drain terminates at the gastroesophageal junction.
Non-obstructive bowel gas pattern.

Mild delay of passage of contrast at the gastroesophageal junction,
likely related to postoperative edema. Expected narrowing at the a
fundoplication site.

Normal appearance of the stomach and opacified small bowel loops. No
contrast extravasation.
IMPRESSION: Expected appearance after hernia repair.

## 2022-03-30 DIAGNOSIS — J301 Allergic rhinitis due to pollen: Secondary | ICD-10-CM | POA: Diagnosis not present

## 2022-03-30 DIAGNOSIS — J3089 Other allergic rhinitis: Secondary | ICD-10-CM | POA: Diagnosis not present

## 2022-03-30 DIAGNOSIS — J3081 Allergic rhinitis due to animal (cat) (dog) hair and dander: Secondary | ICD-10-CM | POA: Diagnosis not present

## 2022-04-14 ENCOUNTER — Other Ambulatory Visit: Payer: Self-pay | Admitting: Nurse Practitioner

## 2022-04-14 DIAGNOSIS — J3081 Allergic rhinitis due to animal (cat) (dog) hair and dander: Secondary | ICD-10-CM

## 2022-04-19 ENCOUNTER — Telehealth: Payer: BC Managed Care – PPO | Admitting: Physician Assistant

## 2022-04-19 DIAGNOSIS — J3081 Allergic rhinitis due to animal (cat) (dog) hair and dander: Secondary | ICD-10-CM | POA: Diagnosis not present

## 2022-04-19 DIAGNOSIS — J3089 Other allergic rhinitis: Secondary | ICD-10-CM | POA: Diagnosis not present

## 2022-04-19 DIAGNOSIS — J301 Allergic rhinitis due to pollen: Secondary | ICD-10-CM | POA: Diagnosis not present

## 2022-04-19 DIAGNOSIS — U071 COVID-19: Secondary | ICD-10-CM

## 2022-04-19 MED ORDER — NIRMATRELVIR/RITONAVIR (PAXLOVID)TABLET: 3.0000 | ORAL_TABLET | Freq: Two times a day (BID) | ORAL | 0 refills | Status: AC

## 2022-04-19 NOTE — Patient Instructions (Signed)
Kyle [name], thank you for joining Mar Daring, PA-C for today's virtual visit.  While this provider is not your primary care provider (PCP), if your PCP is located in our provider database this encounter information will be shared with them immediately following your visit.  Consent: (Patient) Deny Kyle [name] provided verbal consent for this virtual visit at the beginning of the encounter.  Current Medications:  Current Outpatient Medications:    nirmatrelvir/ritonavir EUA (PAXLOVID) 20 x 150 MG & 10 x '100MG'$  TABS, Take 3 tablets by mouth 2 (two) times daily for 5 days. (Take nirmatrelvir 150 mg two tablets twice daily for 5 days and ritonavir 100 mg one tablet twice daily for 5 days) Patient GFR is 102, Disp: 30 tablet, Rfl: 0   Azelastine-Fluticasone 137-50 MCG/ACT SUSP, Place 1-2 sprays into the nose 2 (two) times daily., Disp: , Rfl:    fenofibrate 160 MG tablet, Take 1 tablet (160 mg total) by mouth daily., Disp: 90 tablet, Rfl: 1   fluticasone (FLONASE) 50 MCG/ACT nasal spray, SPRAY 2 SPRAYS INTO EACH NOSTRIL EVERY DAY, Disp: 16 mL, Rfl: 11   hydrochlorothiazide (MICROZIDE) 12.5 MG capsule, Take 1 capsule (12.5 mg total) by mouth daily., Disp: 90 capsule, Rfl: 1   levocetirizine (XYZAL) 5 MG tablet, TAKE 1 TABLET BY MOUTH EVERY DAY IN THE EVENING, Disp: 30 tablet, Rfl: 5   losartan (COZAAR) 25 MG tablet, Take 1 tablet (25 mg total) by mouth daily., Disp: 90 tablet, Rfl: 1   metFORMIN (GLUCOPHAGE-XR) 500 MG 24 hr tablet, TAKE 1 TABLET BY MOUTH EVERY DAY WITH BREAKFAST, Disp: 90 tablet, Rfl: 1   OVER THE COUNTER MEDICATION, Take 1 Scoop by mouth daily. [employer] [place], Disp: , Rfl:    Medications ordered in this encounter:  Meds ordered this encounter  Medications   nirmatrelvir/ritonavir EUA (PAXLOVID) 20 x 150 MG & 10 x '100MG'$  TABS    Sig: Take 3 tablets by mouth 2 (two) times daily for 5 days. (Take nirmatrelvir 150 mg two tablets twice daily for 5 days and ritonavir 100 mg one  tablet twice daily for 5 days) Patient GFR is 102    Dispense:  30 tablet    Refill:  0    Order Specific Question:   Supervising Provider    Answer:   Noemi Chapel [[id number]]     *If you need refills on other medications prior to your next appointment, please contact your pharmacy*  Follow-Up: Call back or seek an in-person evaluation if the symptoms worsen or if the condition fails to improve as anticipated.  Other Instructions COVID-19: Quarantine and Isolation Quarantine If you were exposed Quarantine and stay away from others when you have been in close contact with someone who has COVID-19. Isolate If you are sick or test positive Isolate when you are sick or when you have COVID-19, even if you don't have symptoms. When to stay home Calculating quarantine The date of your exposure is considered day 0. Day 1 is the first full day after your last contact with a person who has had COVID-19. Stay home and away from other people for at least 5 days. Learn why CDC updated guidance for the general public. IF YOU were exposed to COVID-19 and are NOT  up to dateIF YOU were exposed to COVID-19 and are NOT on COVID-19 vaccinations Quarantine for at least 5 days Stay home Stay home and quarantine for at least 5 full days. Wear a well-fitting mask if you must be around others  in your home. Do not travel. Get tested Even if you don't develop symptoms, get tested at least 5 days after you last had close contact with someone with COVID-19. After quarantine Watch for symptoms Watch for symptoms until 10 days after you last had close contact with someone with COVID-19. Avoid travel It is best to avoid travel until a full 10 days after you last had close contact with someone with COVID-19. If you develop symptoms Isolate immediately and get tested. Continue to stay home until you know the results. Wear a well-fitting mask around others. Take precautions until day 10 Wear a well-fitting  mask Wear a well-fitting mask for 10 full days any time you are around others inside your home or in public. Do not go to places where you are unable to wear a well-fitting mask. If you must travel during days 6-10, take precautions. Avoid being around people who are more likely to get very sick from COVID-19. IF YOU were exposed to COVID-19 and are  up to dateIF YOU were exposed to COVID-19 and are on COVID-19 vaccinations No quarantine You do not need to stay home unless you develop symptoms. Get tested Even if you don't develop symptoms, get tested at least 5 days after you last had close contact with someone with COVID-19. Watch for symptoms Watch for symptoms until 10 days after you last had close contact with someone with COVID-19. If you develop symptoms Isolate immediately and get tested. Continue to stay home until you know the results. Wear a well-fitting mask around others. Take precautions until day 10 Wear a well-fitting mask Wear a well-fitting mask for 10 full days any time you are around others inside your home or in public. Do not go to places where you are unable to wear a well-fitting mask. Take precautions if traveling Avoid being around people who are more likely to get very sick from COVID-19. IF YOU were exposed to COVID-19 and had confirmed COVID-19 within the past 90 days (you tested positive using a viral test) No quarantine You do not need to stay home unless you develop symptoms. Watch for symptoms Watch for symptoms until 10 days after you last had close contact with someone with COVID-19. If you develop symptoms Isolate immediately and get tested. Continue to stay home until you know the results. Wear a well-fitting mask around others. Take precautions until day 10 Wear a well-fitting mask Wear a well-fitting mask for 10 full days any time you are around others inside your home or in public. Do not go to places where you are unable to wear a well-fitting  mask. Take precautions if traveling Avoid being around people who are more likely to get very sick from COVID-19. Calculating isolation Day 0 is your first day of symptoms or a positive viral test. Day 1 is the first full day after your symptoms developed or your test specimen was collected. If you have COVID-19 or have symptoms, isolate for at least 5 days. IF YOU tested positive for COVID-19 or have symptoms, regardless of vaccination status Stay home for at least 5 days Stay home for 5 days and isolate from others in your home. Wear a well-fitting mask if you must be around others in your home. Do not travel. Ending isolation if you had symptoms End isolation after 5 full days if you are fever-free for 24 hours (without the use of fever-reducing medication) and your symptoms are improving. Ending isolation if you did NOT have symptoms End  isolation after at least 5 full days after your positive test. If you got very sick from COVID-19 or have a weakened immune system You should isolate for at least 10 days. Consult your doctor before ending isolation. Take precautions until day 10 Wear a well-fitting mask Wear a well-fitting mask for 10 full days any time you are around others inside your home or in public. Do not go to places where you are unable to wear a well-fitting mask. Do not travel Do not travel until a full 10 days after your symptoms started or the date your positive test was taken if you had no symptoms. Avoid being around people who are more likely to get very sick from COVID-19. Definitions Exposure Contact with someone infected with SARS-CoV-2, the virus that causes COVID-19, in a way that increases the likelihood of getting infected with the virus. Close contact A close contact is someone who was less than 6 feet away from an infected person (laboratory-confirmed or a clinical diagnosis) for a cumulative total of 15 minutes or more over a 24-hour period. For example, three  individual 5-minute exposures for a total of 15 minutes. People who are exposed to someone with COVID-19 after they completed at least 5 days of isolation are not considered close contacts. Julio Sicks is a strategy used to prevent transmission of COVID-19 by keeping people who have been in close contact with someone with COVID-19 apart from others. Who does not need to quarantine? If you had close contact with someone with COVID-19 and you are in one of the following groups, you do not need to quarantine. You are up to date with your COVID-19 vaccines. You had confirmed COVID-19 within the last 90 days (meaning you tested positive using a viral test). If you are up to date with COVID-19 vaccines, you should wear a well-fitting mask around others for 10 days from the date of your last close contact with someone with COVID-19 (the date of last close contact is considered day 0). Get tested at least 5 days after you last had close contact with someone with COVID-19. If you test positive or develop COVID-19 symptoms, isolate from other people and follow recommendations in the Isolation section below. If you tested positive for COVID-19 with a viral test within the previous 90 days and subsequently recovered and remain without COVID-19 symptoms, you do not need to quarantine or get tested after close contact. You should wear a well-fitting mask around others for 10 days from the date of your last close contact with someone with COVID-19 (the date of last close contact is considered day 0). If you have COVID-19 symptoms, get tested and isolate from other people and follow recommendations in the Isolation section below. Who should quarantine? If you come into close contact with someone with COVID-19, you should quarantine if you are not up to date on COVID-19 vaccines. This includes people who are not vaccinated. What to do for quarantine Stay home and away from other people for at least 5 days (day 0  through day 5) after your last contact with a person who has COVID-19. The date of your exposure is considered day 0. Wear a well-fitting mask when around others at home, if possible. For 10 days after your last close contact with someone with COVID-19, watch for fever (100.84F or greater), cough, shortness of breath, or other COVID-19 symptoms. If you develop symptoms, get tested immediately and isolate until you receive your test results. If you test positive,  follow isolation recommendations. If you do not develop symptoms, get tested at least 5 days after you last had close contact with someone with COVID-19. If you test negative, you can leave your home, but continue to wear a well-fitting mask when around others at home and in public until 10 days after your last close contact with someone with COVID-19. If you test positive, you should isolate for at least 5 days from the date of your positive test (if you do not have symptoms). If you do develop COVID-19 symptoms, isolate for at least 5 days from the date your symptoms began (the date the symptoms started is day 0). Follow recommendations in the isolation section below. If you are unable to get a test 5 days after last close contact with someone with COVID-19, you can leave your home after day 5 if you have been without COVID-19 symptoms throughout the 5-day period. Wear a well-fitting mask for 10 days after your date of last close contact when around others at home and in public. Avoid people who are have weakened immune systems or are more likely to get very sick from COVID-19, and nursing homes and other high-risk settings, until after at least 10 days. If possible, stay away from people you live with, especially people who are at higher risk for getting very sick from COVID-19, as well as others outside your home throughout the full 10 days after your last close contact with someone with COVID-19. If you are unable to quarantine, you should wear  a well-fitting mask for 10 days when around others at home and in public. If you are unable to wear a mask when around others, you should continue to quarantine for 10 days. Avoid people who have weakened immune systems or are more likely to get very sick from COVID-19, and nursing homes and other high-risk settings, until after at least 10 days. See additional information about travel. Do not go to places where you are unable to wear a mask, such as restaurants and some gyms, and avoid eating around others at home and at work until after 10 days after your last close contact with someone with COVID-19. After quarantine Watch for symptoms until 10 days after your last close contact with someone with COVID-19. If you have symptoms, isolate immediately and get tested. Quarantine in high-risk congregate settings In certain congregate settings that have high risk of secondary transmission (such as Systems analyst and detention facilities, homeless shelters, or cruise ships), CDC recommends a 10-day quarantine for residents, regardless of vaccination and booster status. During periods of critical staffing shortages, facilities may consider shortening the quarantine period for staff to ensure continuity of operations. Decisions to shorten quarantine in these settings should be made in consultation with state, local, tribal, or territorial health departments and should take into consideration the context and characteristics of the facility. CDC's setting-specific guidance provides additional recommendations for these settings. Isolation Isolation is used to separate people with confirmed or suspected COVID-19 from those without COVID-19. People who are in isolation should stay home until it's safe for them to be around others. At home, anyone sick or infected should separate from others, or wear a well-fitting mask when they need to be around others. People in isolation should stay in a specific "sick room" or area  and use a separate bathroom if available. Everyone who has presumed or confirmed COVID-19 should stay home and isolate from other people for at least 5 full days (day 0 is the first  day of symptoms or the date of the day of the positive viral test for asymptomatic persons). They should wear a mask when around others at home and in public for an additional 5 days. People who are confirmed to have COVID-19 or are showing symptoms of COVID-19 need to isolate regardless of their vaccination status. This includes: People who have a positive viral test for COVID-19, regardless of whether or not they have symptoms. People with symptoms of COVID-19, including people who are awaiting test results or have not been tested. People with symptoms should isolate even if they do not know if they have been in close contact with someone with COVID-19. What to do for isolation Monitor your symptoms. If you have an emergency warning sign (including trouble breathing), seek emergency medical care immediately. Stay in a separate room from other household members, if possible. Use a separate bathroom, if possible. Take steps to improve ventilation at home, if possible. Avoid contact with other members of the household and pets. Don't share personal household items, like cups, towels, and utensils. Wear a well-fitting mask when you need to be around other people. Learn more about what to do if you are sick and how to notify your contacts. Ending isolation for people who had COVID-19 and had symptoms If you had COVID-19 and had symptoms, isolate for at least 5 days. To calculate your 5-day isolation period, day 0 is your first day of symptoms. Day 1 is the first full day after your symptoms developed. You can leave isolation after 5 full days. You can end isolation after 5 full days if you are fever-free for 24 hours without the use of fever-reducing medication and your other symptoms have improved (Loss of taste and smell may  persist for weeks or months after recovery and need not delay the end of isolation). You should continue to wear a well-fitting mask around others at home and in public for 5 additional days (day 6 through day 10) after the end of your 5-day isolation period. If you are unable to wear a mask when around others, you should continue to isolate for a full 10 days. Avoid people who have weakened immune systems or are more likely to get very sick from COVID-19, and nursing homes and other high-risk settings, until after at least 10 days. If you continue to have fever or your other symptoms have not improved after 5 days of isolation, you should wait to end your isolation until you are fever-free for 24 hours without the use of fever-reducing medication and your other symptoms have improved. Continue to wear a well-fitting mask through day 10. Contact your healthcare provider if you have questions. See additional information about travel. Do not go to places where you are unable to wear a mask, such as restaurants and some gyms, and avoid eating around others at home and at work until a full 10 days after your first day of symptoms. If an individual has access to a test and wants to test, the best approach is to use an antigen test1 towards the end of the 5-day isolation period. Collect the test sample only if you are fever-free for 24 hours without the use of fever-reducing medication and your other symptoms have improved (loss of taste and smell may persist for weeks or months after recovery and need not delay the end of isolation). If your test result is positive, you should continue to isolate until day 10. If your test result is negative, you  can end isolation, but continue to wear a well-fitting mask around others at home and in public until day 10. Follow additional recommendations for masking and avoiding travel as described above. 1As noted in the labeling for authorized over-the counter antigen tests:  Negative results should be treated as presumptive. Negative results do not rule out SARS-CoV-2 infection and should not be used as the sole basis for treatment or patient management decisions, including infection control decisions. To improve results, antigen tests should be used twice over a three-day period with at least 24 hours and no more than 48 hours between tests. Note that these recommendations on ending isolation do not apply to people who are moderately ill or very sick from COVID-19 or have weakened immune systems. See section below for recommendations for when to end isolation for these groups. Ending isolation for people who tested positive for COVID-19 but had no symptoms If you test positive for COVID-19 and never develop symptoms, isolate for at least 5 days. Day 0 is the day of your positive viral test (based on the date you were tested) and day 1 is the first full day after the specimen was collected for your positive test. You can leave isolation after 5 full days. If you continue to have no symptoms, you can end isolation after at least 5 days. You should continue to wear a well-fitting mask around others at home and in public until day 10 (day 6 through day 10). If you are unable to wear a mask when around others, you should continue to isolate for 10 days. Avoid people who have weakened immune systems or are more likely to get very sick from COVID-19, and nursing homes and other high-risk settings, until after at least 10 days. If you develop symptoms after testing positive, your 5-day isolation period should start over. Day 0 is your first day of symptoms. Follow the recommendations above for ending isolation for people who had COVID-19 and had symptoms. See additional information about travel. Do not go to places where you are unable to wear a mask, such as restaurants and some gyms, and avoid eating around others at home and at work until 10 days after the day of your positive  test. If an individual has access to a test and wants to test, the best approach is to use an antigen test1 towards the end of the 5-day isolation period. If your test result is positive, you should continue to isolate until day 10. If your test result is positive, you can also choose to test daily and if your test result is negative, you can end isolation, but continue to wear a well-fitting mask around others at home and in public until day 10. Follow additional recommendations for masking and avoiding travel as described above. 1As noted in the labeling for authorized over-the counter antigen tests: Negative results should be treated as presumptive. Negative results do not rule out SARS-CoV-2 infection and should not be used as the sole basis for treatment or patient management decisions, including infection control decisions. To improve results, antigen tests should be used twice over a three-day period with at least 24 hours and no more than 48 hours between tests. Ending isolation for people who were moderately or very sick from COVID-19 or have a weakened immune system People who are moderately ill from COVID-19 (experiencing symptoms that affect the lungs like shortness of breath or difficulty breathing) should isolate for 10 days and follow all other isolation precautions. To calculate  your 10-day isolation period, day 0 is your first day of symptoms. Day 1 is the first full day after your symptoms developed. If you are unsure if your symptoms are moderate, talk to a healthcare provider for further guidance. People who are very sick from COVID-19 (this means people who were hospitalized or required intensive care or ventilation support) and people who have weakened immune systems might need to isolate at home longer. They may also require testing with a viral test to determine when they can be around others. CDC recommends an isolation period of at least 10 and up to 20 days for people who were very  sick from COVID-19 and for people with weakened immune systems. Consult with your healthcare provider about when you can resume being around other people. If you are unsure if your symptoms are severe or if you have a weakened immune system, talk to a healthcare provider for further guidance. People who have a weakened immune system should talk to their healthcare provider about the potential for reduced immune responses to COVID-19 vaccines and the need to continue to follow current prevention measures (including wearing a well-fitting mask and avoiding crowds and poorly ventilated indoor spaces) to protect themselves against COVID-19 until advised otherwise by their healthcare provider. Close contacts of immunocompromised people--including household members--should also be encouraged to receive all recommended COVID-19 vaccine doses to help protect these people. Isolation in high-risk congregate settings In certain high-risk congregate settings that have high risk of secondary transmission and where it is not feasible to cohort people (such as Systems analyst and detention facilities, homeless shelters, and cruise ships), CDC recommends a 10-day isolation period for residents. During periods of critical staffing shortages, facilities may consider shortening the isolation period for staff to ensure continuity of operations. Decisions to shorten isolation in these settings should be made in consultation with state, local, tribal, or territorial health departments and should take into consideration the context and characteristics of the facility. CDC's setting-specific guidance provides additional recommendations for these settings. This CDC guidance is meant to supplement--not replace--any federal, state, local, territorial, or tribal health and safety laws, rules, and regulations. Recommendations for specific settings These recommendations do not apply to healthcare professionals. For guidance specific to these  settings, see Healthcare professionals: Interim Guidance for Optician, dispensing with SARS-CoV-2 Infection or Exposure to SARS-CoV-2 Patients, residents, and visitors to healthcare settings: Interim Infection Prevention and Control Recommendations for Healthcare Personnel During the [place] 2019 (COVID-19) Pandemic Additional setting-specific guidance and recommendations are available. These recommendations on quarantine and isolation do apply to [place] settings. Additional guidance is available here: Overview of COVID-19 Quarantine for K-12 Schools Travelers: Travel information and recommendations Congregate facilities and other settings: [employer] for community, work, and school settings Ongoing COVID-19 exposure FAQs I live with someone with COVID-19, but I cannot be separated from them. How do we manage quarantine in this situation? It is very important for people with COVID-19 to remain apart from other people, if possible, even if they are living together. If separation of the person with COVID-19 from others that they live with is not possible, the other people that they live with will have ongoing exposure, meaning they will be repeatedly exposed until that person is no longer able to spread the virus to other people. In this situation, there are precautions you can take to limit the spread of COVID-19: The person with COVID-19 and everyone they live with should wear a well-fitting mask inside the home.  If possible, one person should care for the person with COVID-19 to limit the number of people who are in close contact with the infected person. Take steps to protect yourself and others to reduce transmission in the home: Quarantine if you are not up to date with your COVID-19 vaccines. Isolate if you are sick or tested positive for COVID-19, even if you don't have symptoms. Learn more about the public health recommendations for testing, mask use and quarantine  of close contacts, like yourself, who have ongoing exposure. These recommendations differ depending on your vaccination status. What should I do if I have ongoing exposure to COVID-19 from someone I live with? Recommendations for this situation depend on your vaccination status: If you are not up to date on COVID-19 vaccines and have ongoing exposure to COVID-19, you should: Begin quarantine immediately and continue to quarantine throughout the isolation period of the person with COVID-19. Continue to quarantine for an additional 5 days starting the day after the end of isolation for the person with COVID-19. Get tested at least 5 days after the end of isolation of the infected person that lives with them. If you test negative, you can leave the home but should continue to wear a well-fitting mask when around others at home and in public until 10 days after the end of isolation for the person with COVID-19. Isolate immediately if you develop symptoms of COVID-19 or test positive. If you are up to date with COVID-19 vaccines and have ongoing exposure to COVID-19, you should: Get tested at least 5 days after your first exposure. A person with COVID-19 is considered infectious starting 2 days before they develop symptoms, or 2 days before the date of their positive test if they do not have symptoms. Get tested again at least 5 days after the end of isolation for the person with COVID-19. Wear a well-fitting mask when you are around the person with COVID-19, and do this throughout their isolation period. Wear a well-fitting mask around others for 10 days after the infected person's isolation period ends. Isolate immediately if you develop symptoms of COVID-19 or test positive. What should I do if multiple people I live with test positive for COVID-19 at different times? Recommendations for this situation depend on your vaccination status: If you are not up to date with your COVID-19 vaccines, you  should: Quarantine throughout the isolation period of any infected person that you live with. Continue to quarantine until 5 days after the end of isolation date for the most recently infected person that lives with you. For example, if the last day of isolation of the person most recently infected with COVID-19 was June 30, the new 5-day quarantine period starts on July 1. Get tested at least 5 days after the end of isolation for the most recently infected person that lives with you. Wear a well-fitting mask when you are around any person with COVID-19 while that person is in isolation. Wear a well-fitting mask when you are around other people until 10 days after your last close contact. Isolate immediately if you develop symptoms of COVID-19 or test positive. If you are up to date with your COVID-19 vaccines, you should: Get tested at least 5 days after your first exposure. A person with COVID-19 is considered infectious starting 2 days before they developed symptoms, or 2 days before the date of their positive test if they do not have symptoms. Get tested again at least 5 days after the end of  isolation for the most recently infected person that lives with you. Wear a well-fitting mask when you are around any person with COVID-19 while that person is in isolation. Wear a well-fitting mask around others for 10 days after the end of isolation for the most recently infected person that lives with you. For example, if the last day of isolation for the person most recently infected with COVID-19 was June 30, the new 10-day period to wear a well-fitting mask indoors in public starts on July 1. Isolate immediately if you develop symptoms of COVID-19 or test positive. I had COVID-19 and completed isolation. Do I have to quarantine or get tested if someone I live with gets COVID-19 shortly after I completed isolation? No. If you recently completed isolation and someone that lives with you tests positive for  the virus that causes COVID-19 shortly after the end of your isolation period, you do not have to quarantine or get tested as long as you do not develop new symptoms. Once all of the people that live together have completed isolation or quarantine, refer to the guidance below for new exposures to COVID-19. If you had COVID-19 in the previous 90 days and then came into close contact with someone with COVID-19, you do not have to quarantine or get tested if you do not have symptoms. But you should: Wear a well-fitting mask indoors in public for 10 days after your last close contact. Monitor for COVID-19 symptoms for 10 days from the date of your last close contact. Isolate immediately and get tested if symptoms develop. If more than 90 days have passed since your recovery from infection, follow CDC's recommendations for close contacts. These recommendations will differ depending on your vaccination status. 12/22/2020 Content source: [facility] ([facility]), Division of Viral Diseases This information is not intended to replace advice given to you by your health care provider. Make sure you discuss any questions you have with your health care provider. Document Revised: 04/27/2021 Document Reviewed: 04/27/2021 Elsevier Patient Education  2022 [employer].    If you have been instructed to have an in-person evaluation today at a local Urgent Care facility, please use the link below. It will take you to a list of all of our available [facility], including address, phone number and hours of operation. Please do not delay care.  [facility]  If you or a family member do not have a primary care provider, use the link below to schedule a visit and establish care. When you choose a [facility] [place] primary care physician or advanced practice provider, you gain a long-term partner in health. Find a Primary Care Provider  Learn more about [facility]'s in-office and virtual care options: [facility]

## 2022-04-19 NOTE — Progress Notes (Signed)
Duplicate encounter. Patient has scheduled a video visit

## 2022-04-19 NOTE — Progress Notes (Signed)
Virtual Visit Consent   Fayez Sturgell, you are scheduled for a virtual visit with a [facility] provider today. Just as with appointments in the office, your consent must be obtained to participate. Your consent will be active for this visit and any virtual visit you may have with one of our providers in the next 365 days. If you have a MyChart account, a copy of this consent can be sent to you electronically.  As this is a virtual visit, video technology does not allow for your provider to perform a traditional examination. This may limit your provider's ability to fully assess your condition. If your provider identifies any concerns that need to be evaluated in person or the need to arrange testing (such as labs, EKG, etc.), we will make arrangements to do so. Although advances in technology are sophisticated, we cannot ensure that it will always work on either your end or our end. If the connection with a video visit is poor, the visit may have to be switched to a telephone visit. With either a video or telephone visit, we are not always able to ensure that we have a secure connection.  By engaging in this virtual visit, you consent to the provision of healthcare and authorize for your insurance to be billed (if applicable) for the services provided during this visit. Depending on your insurance coverage, you may receive a charge related to this service.  I need to obtain your verbal consent now. Are you willing to proceed with your visit today? Kyle [name] has provided verbal consent on 04/19/2022 for a virtual visit (video or telephone). Mar Daring, PA-C  Date: 04/19/2022 7:58 AM  Virtual Visit via Video Note   I, Mar Daring, connected with  Kyle [name]  ([id number], Jan 29, 1976) on 04/19/22 at  7:45 AM EDT by a video-enabled telemedicine application and verified that I am speaking with the correct person using two identifiers.  Location: Patient: Virtual Visit Location Patient:  Home Provider: Virtual Visit Location Provider: Home Office   I discussed the limitations of evaluation and management by telemedicine and the availability of in person appointments. The patient expressed understanding and agreed to proceed.    History of Present Illness: Kyle [name] is a 46 y.o. who identifies as a male who was assigned male at birth, and is being seen today for Covid 58.  HPI: URI  This is a new problem. Episode onset: tested positive for covid 19 last night; symptoms started Monday. The problem has been gradually worsening. There has been no fever. Associated symptoms include congestion, coughing and a sore throat. Pertinent negatives include no diarrhea, headaches, nausea, rhinorrhea, sinus pain or vomiting. He has tried NSAIDs for the symptoms. The treatment provided mild relief.     Problems:  Patient Active Problem List   Diagnosis Date Noted   Allergic rhinitis 08/02/2021   History of alcohol abuse 08/02/2021   Moderate major depression, single episode ([place]) 08/02/2021   Pharyngeal dysphagia 08/02/2021   S/P laparoscopic fundoplication 36/64/4034   Hyperlipidemia 05/10/2020   Type 2 diabetes mellitus with hyperglycemia, without long-term current use of insulin ([place]) 05/10/2020   Recovering alcoholic ([place]) 74/25/9563   Depression with anxiety 05/10/2020   Sepsis ([place]) 05/01/2020   Essential hypertension 05/01/2020   Multifocal pneumonia    Acute respiratory failure with hypoxia ([place]) 04/30/2020   GERD (gastroesophageal reflux disease) 11/26/2010    Allergies:  Allergies  Allergen Reactions   Benzonatate Other (See Comments)    Restless  legs    Promethazine Other (See Comments)    Restless legs Restless legs   Medications:  Current Outpatient Medications:    nirmatrelvir/ritonavir EUA (PAXLOVID) 20 x 150 MG & 10 x '100MG'$  TABS, Take 3 tablets by mouth 2 (two) times daily for 5 days. (Take nirmatrelvir 150 mg two tablets twice daily for 5 days and  ritonavir 100 mg one tablet twice daily for 5 days) Patient GFR is 102, Disp: 30 tablet, Rfl: 0   Azelastine-Fluticasone 137-50 MCG/ACT SUSP, Place 1-2 sprays into the nose 2 (two) times daily., Disp: , Rfl:    fenofibrate 160 MG tablet, Take 1 tablet (160 mg total) by mouth daily., Disp: 90 tablet, Rfl: 1   fluticasone (FLONASE) 50 MCG/ACT nasal spray, SPRAY 2 SPRAYS INTO EACH NOSTRIL EVERY DAY, Disp: 16 mL, Rfl: 11   hydrochlorothiazide (MICROZIDE) 12.5 MG capsule, Take 1 capsule (12.5 mg total) by mouth daily., Disp: 90 capsule, Rfl: 1   levocetirizine (XYZAL) 5 MG tablet, TAKE 1 TABLET BY MOUTH EVERY DAY IN THE EVENING, Disp: 30 tablet, Rfl: 5   losartan (COZAAR) 25 MG tablet, Take 1 tablet (25 mg total) by mouth daily., Disp: 90 tablet, Rfl: 1   metFORMIN (GLUCOPHAGE-XR) 500 MG 24 hr tablet, TAKE 1 TABLET BY MOUTH EVERY DAY WITH BREAKFAST, Disp: 90 tablet, Rfl: 1   OVER THE COUNTER MEDICATION, Take 1 Scoop by mouth daily. Athletic Greens, Disp: , Rfl:   Observations/Objective: Patient is well-developed, well-nourished in no acute distress.  Resting comfortably at home.  Head is normocephalic, atraumatic.  No labored breathing.  Speech is clear and coherent with logical content.  Patient is alert and oriented at baseline.    Assessment and Plan: 1. COVID-19 - nirmatrelvir/ritonavir EUA (PAXLOVID) 20 x 150 MG & 10 x '100MG'$  TABS; Take 3 tablets by mouth 2 (two) times daily for 5 days. (Take nirmatrelvir 150 mg two tablets twice daily for 5 days and ritonavir 100 mg one tablet twice daily for 5 days) Patient GFR is 102  Dispense: 30 tablet; Refill: 0  - Continue OTC symptomatic management of choice - Will send OTC vitamins and supplement information through AVS - Paxlovid prescribed - Patient enrolled in MyChart symptom monitoring - Push fluids - Rest as needed - Discussed return precautions and when to seek in-person evaluation, sent via AVS as well   Follow Up Instructions: I  discussed the assessment and treatment plan with the patient. The patient was provided an opportunity to ask questions and all were answered. The patient agreed with the plan and demonstrated an understanding of the instructions.  A copy of instructions were sent to the patient via MyChart unless otherwise noted below.    The patient was advised to call back or seek an in-person evaluation if the symptoms worsen or if the condition fails to improve as anticipated.  Time:  I spent 15 minutes with the patient via telehealth technology discussing the above problems/concerns.    Mar Daring, PA-C

## 2022-05-01 DIAGNOSIS — J3081 Allergic rhinitis due to animal (cat) (dog) hair and dander: Secondary | ICD-10-CM | POA: Diagnosis not present

## 2022-05-01 DIAGNOSIS — J301 Allergic rhinitis due to pollen: Secondary | ICD-10-CM | POA: Diagnosis not present

## 2022-05-01 DIAGNOSIS — J3089 Other allergic rhinitis: Secondary | ICD-10-CM | POA: Diagnosis not present

## 2022-05-04 DIAGNOSIS — J3081 Allergic rhinitis due to animal (cat) (dog) hair and dander: Secondary | ICD-10-CM | POA: Diagnosis not present

## 2022-05-04 DIAGNOSIS — J3089 Other allergic rhinitis: Secondary | ICD-10-CM | POA: Diagnosis not present

## 2022-05-04 DIAGNOSIS — J301 Allergic rhinitis due to pollen: Secondary | ICD-10-CM | POA: Diagnosis not present

## 2022-05-19 NOTE — Telephone Encounter (Signed)
Na

## 2022-06-26 ENCOUNTER — Other Ambulatory Visit: Payer: Self-pay | Admitting: Family Medicine

## 2022-06-27 ENCOUNTER — Encounter: Payer: Self-pay | Admitting: Family Medicine

## 2022-06-27 NOTE — Telephone Encounter (Signed)
Patient is out of medications.  He said that pharmacy has sent requests with no response.  Patient can be reached at [phone].

## 2022-06-29 DIAGNOSIS — J3089 Other allergic rhinitis: Secondary | ICD-10-CM | POA: Diagnosis not present

## 2022-06-29 DIAGNOSIS — J301 Allergic rhinitis due to pollen: Secondary | ICD-10-CM | POA: Diagnosis not present

## 2022-06-29 DIAGNOSIS — J3081 Allergic rhinitis due to animal (cat) (dog) hair and dander: Secondary | ICD-10-CM | POA: Diagnosis not present

## 2022-07-19 ENCOUNTER — Other Ambulatory Visit: Payer: Self-pay | Admitting: Family Medicine

## 2022-07-20 DIAGNOSIS — J301 Allergic rhinitis due to pollen: Secondary | ICD-10-CM | POA: Diagnosis not present

## 2022-07-20 DIAGNOSIS — J3089 Other allergic rhinitis: Secondary | ICD-10-CM | POA: Diagnosis not present

## 2022-07-20 DIAGNOSIS — J3081 Allergic rhinitis due to animal (cat) (dog) hair and dander: Secondary | ICD-10-CM | POA: Diagnosis not present

## 2022-07-23 ENCOUNTER — Other Ambulatory Visit: Payer: Self-pay | Admitting: Family Medicine

## 2022-07-26 MED ORDER — HYDROCHLOROTHIAZIDE 12.5 MG PO CAPS: 12.5000 mg | ORAL_CAPSULE | Freq: Every day | ORAL | 0 refills | Status: DC

## 2022-07-26 MED ORDER — FENOFIBRATE 160 MG PO TABS: 160.0000 mg | ORAL_TABLET | Freq: Every day | ORAL | 0 refills | Status: DC

## 2022-07-26 MED ORDER — METFORMIN HCL ER 500 MG PO TB24: ORAL_TABLET | ORAL | 0 refills | Status: DC

## 2022-08-02 ENCOUNTER — Other Ambulatory Visit: Payer: Self-pay | Admitting: Family Medicine

## 2022-08-03 DIAGNOSIS — J301 Allergic rhinitis due to pollen: Secondary | ICD-10-CM | POA: Diagnosis not present

## 2022-08-03 DIAGNOSIS — J3089 Other allergic rhinitis: Secondary | ICD-10-CM | POA: Diagnosis not present

## 2022-08-03 DIAGNOSIS — J3081 Allergic rhinitis due to animal (cat) (dog) hair and dander: Secondary | ICD-10-CM | POA: Diagnosis not present

## 2022-08-07 DIAGNOSIS — J301 Allergic rhinitis due to pollen: Secondary | ICD-10-CM | POA: Diagnosis not present

## 2022-08-07 DIAGNOSIS — J3081 Allergic rhinitis due to animal (cat) (dog) hair and dander: Secondary | ICD-10-CM | POA: Diagnosis not present

## 2022-08-07 DIAGNOSIS — J3089 Other allergic rhinitis: Secondary | ICD-10-CM | POA: Diagnosis not present

## 2022-08-07 DIAGNOSIS — H1010 Acute atopic conjunctivitis, unspecified eye: Secondary | ICD-10-CM | POA: Diagnosis not present

## 2022-08-08 NOTE — Patient Instructions (Signed)
Health Maintenance, Male Adopting a healthy lifestyle and getting preventive care are important in promoting health and wellness. Ask your health care provider about: The right schedule for you to have regular tests and exams. Things you can do on your own to prevent diseases and keep yourself healthy. What should I know about diet, weight, and exercise? Eat a healthy diet  Eat a diet that includes plenty of vegetables, fruits, low-fat dairy products, and lean protein. Do not eat a lot of foods that are high in solid fats, added sugars, or sodium. Maintain a healthy weight Body mass index (BMI) is a measurement that can be used to identify possible weight problems. It estimates body fat based on height and weight. Your health care provider can help determine your BMI and help you achieve or maintain a healthy weight. Get regular exercise Get regular exercise. This is one of the most important things you can do for your health. Most adults should: Exercise for at least 150 minutes each week. The exercise should increase your heart rate and make you sweat (moderate-intensity exercise). Do strengthening exercises at least twice a week. This is in addition to the moderate-intensity exercise. Spend less time sitting. Even light physical activity can be beneficial. Watch cholesterol and blood lipids Have your blood tested for lipids and cholesterol at 47 years of age, then have this test every 5 years. You may need to have your cholesterol levels checked more often if: Your lipid or cholesterol levels are high. You are older than 47 years of age. You are at high risk for heart disease. What should I know about cancer screening? Many types of cancers can be detected early and may often be prevented. Depending on your health history and family history, you may need to have cancer screening at various ages. This may include screening for: Colorectal cancer. Prostate cancer. Skin cancer. Lung  cancer. What should I know about heart disease, diabetes, and high blood pressure? Blood pressure and heart disease High blood pressure causes heart disease and increases the risk of stroke. This is more likely to develop in people who have high blood pressure readings or are overweight. Talk with your health care provider about your target blood pressure readings. Have your blood pressure checked: Every 3-5 years if you are 18-39 years of age. Every year if you are 40 years old or older. If you are between the ages of 65 and 75 and are a current or former smoker, ask your health care provider if you should have a one-time screening for abdominal aortic aneurysm (AAA). Diabetes Have regular diabetes screenings. This checks your fasting blood sugar level. Have the screening done: Once every three years after age 45 if you are at a normal weight and have a low risk for diabetes. More often and at a younger age if you are overweight or have a high risk for diabetes. What should I know about preventing infection? Hepatitis B If you have a higher risk for hepatitis B, you should be screened for this virus. Talk with your health care provider to find out if you are at risk for hepatitis B infection. Hepatitis C Blood testing is recommended for: Everyone born from 1945 through 1965. Anyone with known risk factors for hepatitis C. Sexually transmitted infections (STIs) You should be screened each year for STIs, including gonorrhea and chlamydia, if: You are sexually active and are younger than 47 years of age. You are older than 47 years of age and your   health care provider tells you that you are at risk for this type of infection. Your sexual activity has changed since you were last screened, and you are at increased risk for chlamydia or gonorrhea. Ask your health care provider if you are at risk. Ask your health care provider about whether you are at high risk for HIV. Your health care provider  may recommend a prescription medicine to help prevent HIV infection. If you choose to take medicine to prevent HIV, you should first get tested for HIV. You should then be tested every 3 months for as long as you are taking the medicine. Follow these instructions at home: Alcohol use Do not drink alcohol if your health care provider tells you not to drink. If you drink alcohol: Limit how much you have to 0-2 drinks a day. Know how much alcohol is in your drink. In the [place] one drink equals one 12 oz bottle of beer (355 mL), one 5 oz glass of wine (148 mL), or one 1 oz glass of hard liquor (44 mL). Lifestyle Do not use any products that contain nicotine or tobacco. These products include cigarettes, chewing tobacco, and vaping devices, such as e-cigarettes. If you need help quitting, ask your health care provider. Do not use street drugs. Do not share needles. Ask your health care provider for help if you need support or information about quitting drugs. General instructions Schedule regular health, dental, and eye exams. Stay current with your vaccines. Tell your health care provider if: You often feel depressed. You have ever been abused or do not feel safe at home. Summary Adopting a healthy lifestyle and getting preventive care are important in promoting health and wellness. Follow your health care provider's instructions about healthy diet, exercising, and getting tested or screened for diseases. Follow your health care provider's instructions on monitoring your cholesterol and blood pressure. This information is not intended to replace advice given to you by your health care provider. Make sure you discuss any questions you have with your health care provider. Document Revised: 01/31/2021 Document Reviewed: 01/31/2021 Elsevier Patient Education  2023 [employer]  

## 2022-08-09 ENCOUNTER — Encounter: Payer: Self-pay | Admitting: Family Medicine

## 2022-08-09 ENCOUNTER — Ambulatory Visit (INDEPENDENT_AMBULATORY_CARE_PROVIDER_SITE_OTHER): Payer: BC Managed Care – PPO | Admitting: Family Medicine

## 2022-08-09 VITALS — BP 107/62 | HR 54 | Temp 98.6°F | Ht 73.75 in | Wt 184.0 lb

## 2022-08-09 DIAGNOSIS — J3081 Allergic rhinitis due to animal (cat) (dog) hair and dander: Secondary | ICD-10-CM

## 2022-08-09 DIAGNOSIS — E119 Type 2 diabetes mellitus without complications: Secondary | ICD-10-CM

## 2022-08-09 DIAGNOSIS — R3915 Urgency of urination: Secondary | ICD-10-CM | POA: Diagnosis not present

## 2022-08-09 DIAGNOSIS — I10 Essential (primary) hypertension: Secondary | ICD-10-CM

## 2022-08-09 DIAGNOSIS — E781 Pure hyperglyceridemia: Secondary | ICD-10-CM | POA: Diagnosis not present

## 2022-08-09 DIAGNOSIS — Z0000 Encounter for general adult medical examination without abnormal findings: Secondary | ICD-10-CM

## 2022-08-09 DIAGNOSIS — N3281 Overactive bladder: Secondary | ICD-10-CM

## 2022-08-09 DIAGNOSIS — R3589 Other polyuria: Secondary | ICD-10-CM | POA: Diagnosis not present

## 2022-08-09 LAB — LIPID PANEL
Cholesterol: 177 mg/dL (ref 0–200)
HDL: 88 mg/dL (ref >39.00)
LDL Cholesterol: 77 mg/dL (ref 0–99)
NonHDL: 88.82
Total CHOL/HDL Ratio: 2
Triglycerides: 57 mg/dL (ref 0.0–149.0)
VLDL: 11.4 mg/dL (ref 0.0–40.0)

## 2022-08-09 LAB — COMPREHENSIVE METABOLIC PANEL
ALT: 45 U/L (ref 0–53)
AST: 20 U/L (ref 0–37)
Albumin: 4.8 g/dL (ref 3.5–5.2)
Alkaline Phosphatase: 30 U/L — ABNORMAL LOW (ref 39–117)
BUN: 17 mg/dL (ref 6–23)
CO2: 32 mEq/L (ref 19–32)
Calcium: 9.6 mg/dL (ref 8.4–10.5)
Chloride: 97 mEq/L (ref 96–112)
Creatinine, Ser: 1.05 mg/dL (ref 0.40–1.50)
GFR: 84.73 mL/min (ref >60.00)
Glucose, Bld: 98 mg/dL (ref 70–99)
Potassium: 4.1 mEq/L (ref 3.5–5.1)
Sodium: 135 mEq/L (ref 135–145)
Total Bilirubin: 0.5 mg/dL (ref 0.2–1.2)
Total Protein: 7 g/dL (ref 6.0–8.3)

## 2022-08-09 LAB — POCT URINALYSIS DIPSTICK
Bilirubin, UA: NEGATIVE
Blood, UA: NEGATIVE
Glucose, UA: NEGATIVE
Ketones, UA: NEGATIVE
Leukocytes, UA: NEGATIVE
Nitrite, UA: NEGATIVE
Protein, UA: NEGATIVE
Spec Grav, UA: 1.015 (ref 1.010–1.025)
Urobilinogen, UA: 0.2 E.U./dL
pH, UA: 7.5 (ref 5.0–8.0)

## 2022-08-09 LAB — CBC
HCT: 41 % (ref 39.0–52.0)
Hemoglobin: 13.6 g/dL (ref 13.0–17.0)
MCHC: 33.3 g/dL (ref 30.0–36.0)
MCV: 90.2 fl (ref 78.0–100.0)
Platelets: 213 10*3/uL (ref 150.0–400.0)
RBC: 4.54 Mil/uL (ref 4.22–5.81)
RDW: 13.6 % (ref 11.5–15.5)
WBC: 6 10*3/uL (ref 4.0–10.5)

## 2022-08-09 LAB — MICROALBUMIN / CREATININE URINE RATIO
Creatinine,U: 98.4 mg/dL
Microalb Creat Ratio: 0.7 mg/g (ref 0.0–30.0)
Microalb, Ur: <0.7 mg/dL (ref 0.0–1.9)

## 2022-08-09 LAB — TSH: TSH: 1.23 u[IU]/mL (ref 0.35–5.50)

## 2022-08-09 LAB — HEMOGLOBIN A1C: Hgb A1c MFr Bld: 6.3 % (ref 4.6–6.5)

## 2022-08-09 MED ORDER — HYDROCHLOROTHIAZIDE 12.5 MG PO CAPS: 12.5000 mg | ORAL_CAPSULE | Freq: Every day | ORAL | 1 refills | Status: DC

## 2022-08-09 MED ORDER — LEVOCETIRIZINE DIHYDROCHLORIDE 5 MG PO TABS: ORAL_TABLET | ORAL | 1 refills | Status: DC

## 2022-08-09 MED ORDER — METFORMIN HCL ER 500 MG PO TB24: ORAL_TABLET | ORAL | 1 refills | Status: DC

## 2022-08-09 MED ORDER — FENOFIBRATE 160 MG PO TABS: 160.0000 mg | ORAL_TABLET | Freq: Every day | ORAL | 1 refills | Status: DC

## 2022-08-09 MED ORDER — OXYBUTYNIN CHLORIDE ER 5 MG PO TB24: 5.0000 mg | ORAL_TABLET | Freq: Every day | ORAL | 0 refills | Status: DC

## 2022-08-09 MED ORDER — LOSARTAN POTASSIUM 25 MG PO TABS: 25.0000 mg | ORAL_TABLET | Freq: Every day | ORAL | 1 refills | Status: DC

## 2022-08-09 NOTE — Progress Notes (Signed)
Office Note 08/09/2022  CC:  Chief Complaint  Patient presents with   Annual Exam    Pt is fasting    HPI:  Patient is a 46 y.o. male who is here for annual health maintenance exam and 103-monthfollow-up diabetes, hypertension, and hypertriglyceridemia.  Overall doing very well.  Great exercise and dietary habits.  He has lost a good amount of weight purposefully.  For about the last year he has noticed a pattern of urination that is different for him.  On Monday Tuesday and Wednesday he has very frequent and urgent urination.  He has no delay in urinating, has a strong stream, and does dribble for a while after emptying bladder.  No pain with urination.  Gets up usually 1 time a night to urinate.  Says on weekends things calm down quite a bit.  He notes that he does not eat nearly as healthy on the weekends.  Denies polydipsia.  No blood in urine.  Past Medical History:  Diagnosis Date   Alcoholism in recovery ([facility]    quit 08/2019 (rehab)   Allergic rhinitis    Anxiety and depression    Hx of-->when in recovery period from alcoholism   Diabetes mellitus without complication (HCC)    Elevated liver enzymes    hx of, when he was drinking ETOH heavily   GERD (gastroesophageal reflux disease)    resolved with Nissen/HH surgery 2022   History of pneumonia    Hyperlipidemia    Hypertension     Past Surgical History:  Procedure Laterality Date   ESOPHAGOGASTRODUODENOSCOPY     2021 [facility]  HIATAL HERNIA REPAIR N/A 04/07/2021   Procedure: LAPAROSCOPIC HIATAL HERNIA REPAIR WITH NISSEN FUNDOPLICATION AND GASTRORRHAPHY;  Surgeon: WGreer Pickerel MD;  Location: [facility];  Service: General;  Laterality: N/A;   left leg surgery     SINUSOTOMY     TONSILLECTOMY AND ADENOIDECTOMY  1995   UPPER GI ENDOSCOPY  04/07/2021   Procedure: UPPER GI ENDOSCOPY;  Surgeon: WGreer Pickerel MD;  Location: [facility];  Service: General;;   WRIST FRACTURE SURGERY      Family  History  Problem Relation Age of Onset   Healthy Mother    Hypertension Father    Hyperlipidemia Father    Prostate cancer Father    Bladder Cancer Father    Birth defects Maternal Grandfather    Bone cancer Maternal Grandfather    Birth defects Paternal Grandfather    Cancer Maternal Uncle     Social History   Socioeconomic History   Marital status: Married    Spouse name: Not on file   Number of children: 2   Years of education: Not on file   Highest education level: Not on file  Occupational History   Not on file  Tobacco Use   Smoking status: Former   Smokeless tobacco: Former    Quit date: 10/29/2017  Vaping Use   Vaping Use: Former   Quit date: 11/23/2020  Substance and Sexual Activity   Alcohol use: Not Currently    Alcohol/week: 4.0 standard drinks of alcohol    Types: 1 Glasses of wine, 1 Cans of beer, 1 Shots of liquor, 1 Standard drinks or equivalent per week    Comment: no alcohol in 8-921month  Drug use: Not Currently    Types: Marijuana   Sexual activity: Yes  Other Topics Concern   Not on file  Social History Narrative   Married, 1 son  and 1 daughter.   Orig from [place], [place].   Educ: college grad   Occup: Freight forwarder.   Tob: none   Alc: Alcoholism, sober 2020-->went to rehab in [place]   Drugs: none   Social Determinants of Radio broadcast assistant Strain: Not on file  Food Insecurity: Not on file  Transportation Needs: Not on file  Physical Activity: Not on file  Stress: Not on file  Social Connections: Not on file  Intimate Partner Violence: Not on file    Outpatient Medications Prior to Visit  Medication Sig Dispense Refill   Azelastine-Fluticasone 137-50 MCG/ACT SUSP Place 1-2 sprays into the nose 2 (two) times daily.     fenofibrate 160 MG tablet Take 1 tablet (160 mg total) by mouth daily. 15 tablet 0   fluticasone (FLONASE) 50 MCG/ACT nasal spray SPRAY 2 SPRAYS INTO EACH NOSTRIL EVERY DAY 16 mL 11    hydrochlorothiazide (MICROZIDE) 12.5 MG capsule Take 1 capsule (12.5 mg total) by mouth daily. 15 capsule 0   levocetirizine (XYZAL) 5 MG tablet TAKE 1 TABLET BY MOUTH EVERY DAY IN THE EVENING 30 tablet 5   losartan (COZAAR) 25 MG tablet Take 1 tablet (25 mg total) by mouth daily. 90 tablet 1   metFORMIN (GLUCOPHAGE-XR) 500 MG 24 hr tablet TAKE 1 TABLET BY MOUTH EVERY DAY WITH BREAKFAST 15 tablet 0   OVER THE COUNTER MEDICATION Take 1 Scoop by mouth daily. Athletic Greens     No facility-administered medications prior to visit.    Allergies  Allergen Reactions   Benzonatate Other (See Comments)    Restless legs    Promethazine Other (See Comments)    Restless legs Restless legs    ROS Review of Systems  Constitutional:  Negative for appetite change, chills, fatigue and fever.  HENT:  Negative for congestion, dental problem, ear pain and sore throat.   Eyes:  Negative for discharge, redness and visual disturbance.  Respiratory:  Negative for cough, chest tightness, shortness of breath and wheezing.   Cardiovascular:  Negative for chest pain, palpitations and leg swelling.  Gastrointestinal:  Negative for abdominal pain, blood in stool, diarrhea, nausea and vomiting.  Genitourinary:  Positive for frequency and urgency. Negative for difficulty urinating, dysuria, flank pain and hematuria.  Musculoskeletal:  Negative for arthralgias, back pain, joint swelling, myalgias and neck stiffness.  Skin:  Negative for pallor and rash.  Neurological:  Negative for dizziness, speech difficulty, weakness and headaches.  Hematological:  Negative for adenopathy. Does not bruise/bleed easily.  Psychiatric/Behavioral:  Negative for confusion and sleep disturbance. The patient is not nervous/anxious.     PE;    08/09/2022    7:57 AM 01/27/2022    2:37 PM 11/11/2021   10:28 AM  Vitals with BMI  Height 6' 1.75" '6\' 2"'$    Weight 184 lbs 202 lbs 6 oz   BMI 70.96 28.36   Systolic 629 476 546  Diastolic  62 68 76  Pulse 54 66 70     Gen: Alert, well appearing.  Patient is oriented to person, place, time, and situation. AFFECT: pleasant, lucid thought and speech. ENT: Ears: EACs clear, normal epithelium.  TMs with good light reflex and landmarks bilaterally.  Eyes: no injection, icteris, swelling, or exudate.  EOMI, PERRLA. Nose: no drainage or turbinate edema/swelling.  No injection or focal lesion.  Mouth: lips without lesion/swelling.  Oral mucosa pink and moist.  Dentition intact and without obvious caries or gingival swelling.  Oropharynx  without erythema, exudate, or swelling.  Neck: supple/nontender.  No LAD, mass, or TM.  Carotid pulses 2+ bilaterally, without bruits. CV: RRR, no m/r/g.   LUNGS: CTA bilat, nonlabored resps, good aeration in all lung fields. ABD: soft, NT, ND, BS normal.  No hepatospenomegaly or mass.  No bruits. EXT: no clubbing, cyanosis, or edema.  Musculoskeletal: no joint swelling, erythema, warmth, or tenderness.  ROM of all joints intact. Skin - no sores or suspicious lesions or rashes or color changes Rectal exam: negative without mass, lesions or tenderness, PROSTATE EXAM: smooth and symmetric without nodules or tenderness.  Pertinent labs:  Lab Results  Component Value Date   TSH 0.887 05/02/2020   Lab Results  Component Value Date   WBC 11.9 (H) 04/08/2021   HGB 12.0 (L) 04/08/2021   HCT 37.0 (L) 04/08/2021   MCV 89.6 04/08/2021   PLT 182 04/08/2021   Lab Results  Component Value Date   CREATININE 1.07 01/27/2022   BUN 27 (H) 01/27/2022   NA 138 01/27/2022   K 3.9 01/27/2022   CL 100 01/27/2022   CO2 25 01/27/2022   Lab Results  Component Value Date   ALT 12 01/27/2022   AST 13 01/27/2022   ALKPHOS 42 08/15/2021   BILITOT 0.4 01/27/2022   Lab Results  Component Value Date   CHOL 144 08/15/2021   Lab Results  Component Value Date   HDL 72.50 08/15/2021   Lab Results  Component Value Date   LDLCALC 58 08/15/2021   Lab Results   Component Value Date   TRIG 69.0 08/15/2021   Lab Results  Component Value Date   CHOLHDL 2 08/15/2021   Lab Results  Component Value Date   HGBA1C 5.3 01/27/2022   HGBA1C 5.3 01/27/2022   HGBA1C 5.3 (A) 01/27/2022   HGBA1C 5.3 01/27/2022   ASSESSMENT AND PLAN:   1) Health maintenance exam: Reviewed age and gender appropriate health maintenance issues (prudent diet, regular exercise, health risks of tobacco and excessive alcohol, use of seatbelts, fire alarms in home, use of sunscreen).  Also reviewed age and gender appropriate health screening as well as vaccine recommendations. Vaccines: Flu->UTD Labs: HP Prostate ca screening: average risk patient= as per latest guidelines, start screening at 44 yrs of age. Colon ca screening: no polyps 09/2020-->recall 2032.  2) urinary urgency and frequency, cyclic. UA today: normal.  PVR today approx 50cc. We will do trial of oxybutynin.  An After Visit Summary was printed and given to the patient.  FOLLOW UP:  Return in about 2 weeks (around 08/23/2022) for f/u [facility].  Signed:  Crissie Sickles, MD           08/09/2022

## 2022-08-10 ENCOUNTER — Telehealth: Payer: Self-pay

## 2022-08-10 MED ORDER — METFORMIN HCL ER 500 MG PO TB24: ORAL_TABLET | ORAL | 1 refills | Status: DC

## 2022-08-10 NOTE — Telephone Encounter (Signed)
-----   Message from Tammi Sou, MD sent at 08/10/2022  8:32 AM EST ----- Hemoglobin A1c has risen from 5.3% to 6.3% over the last 6 months. This is still within his goal of 7% or less. I think it would be best if he started to take his metformin 500 mg tab TWICE per day. Metformin 500 mg, 1 tab p.o. twice daily, #180, refill x1. The remainder of his labs are completely normal and this is great.

## 2022-08-24 DIAGNOSIS — J301 Allergic rhinitis due to pollen: Secondary | ICD-10-CM | POA: Diagnosis not present

## 2022-08-24 DIAGNOSIS — J3089 Other allergic rhinitis: Secondary | ICD-10-CM | POA: Diagnosis not present

## 2022-08-24 DIAGNOSIS — J3081 Allergic rhinitis due to animal (cat) (dog) hair and dander: Secondary | ICD-10-CM | POA: Diagnosis not present

## 2022-08-31 ENCOUNTER — Other Ambulatory Visit: Payer: Self-pay | Admitting: Family Medicine

## 2022-09-04 ENCOUNTER — Other Ambulatory Visit: Payer: Self-pay | Admitting: Family Medicine

## 2022-09-07 DIAGNOSIS — J301 Allergic rhinitis due to pollen: Secondary | ICD-10-CM | POA: Diagnosis not present

## 2022-09-07 DIAGNOSIS — J3081 Allergic rhinitis due to animal (cat) (dog) hair and dander: Secondary | ICD-10-CM | POA: Diagnosis not present

## 2022-09-07 DIAGNOSIS — J3089 Other allergic rhinitis: Secondary | ICD-10-CM | POA: Diagnosis not present

## 2022-09-08 ENCOUNTER — Telehealth: Payer: Self-pay | Admitting: Family Medicine

## 2022-09-08 ENCOUNTER — Other Ambulatory Visit: Payer: Self-pay | Admitting: Family Medicine

## 2022-09-08 NOTE — Telephone Encounter (Signed)
Pt is needing a refill on Oxybutynin. He reports that he is currently at [facility], however I informed him this will most likely be taken care of next week.  Please let the patient know when the refill has been sent.

## 2022-09-08 NOTE — Telephone Encounter (Signed)
Pt was advised medication was sent, appt scheduled.

## 2022-09-21 ENCOUNTER — Ambulatory Visit: Payer: BC Managed Care – PPO | Admitting: Family Medicine

## 2022-09-24 ENCOUNTER — Encounter: Payer: Self-pay | Admitting: Family Medicine

## 2022-09-27 NOTE — Telephone Encounter (Signed)
No further action needed.

## 2022-09-28 ENCOUNTER — Ambulatory Visit (INDEPENDENT_AMBULATORY_CARE_PROVIDER_SITE_OTHER): Payer: BC Managed Care – PPO | Admitting: Family Medicine

## 2022-09-28 VITALS — BP 134/74 | HR 63 | Temp 97.7°F | Ht 73.75 in | Wt 181.0 lb

## 2022-09-28 DIAGNOSIS — Z720 Tobacco use: Secondary | ICD-10-CM

## 2022-09-28 DIAGNOSIS — E119 Type 2 diabetes mellitus without complications: Secondary | ICD-10-CM

## 2022-09-28 DIAGNOSIS — N3281 Overactive bladder: Secondary | ICD-10-CM | POA: Diagnosis not present

## 2022-09-28 MED ORDER — METFORMIN HCL ER 500 MG PO TB24: ORAL_TABLET | ORAL | 1 refills | Status: DC

## 2022-09-28 MED ORDER — OXYBUTYNIN CHLORIDE ER 10 MG PO TB24: 10.0000 mg | ORAL_TABLET | Freq: Every day | ORAL | 1 refills | Status: DC

## 2022-09-28 MED ORDER — VARENICLINE TARTRATE 1 MG PO TABS: ORAL_TABLET | ORAL | 0 refills | Status: DC

## 2022-09-28 NOTE — Progress Notes (Signed)
OFFICE VISIT  09/28/2022  CC:  Chief Complaint  Patient presents with   Follow up    Patient is a 47 y.o. male who presents for 6-week follow-up for urinary concerns. A/P as of last visit: "Urinary urgency and frequency, cyclic. UA today: normal.  PVR today approx 50cc. We will do trial of oxybutynin."  INTERIM HX: His labs at last visit showed that his hemoglobin A1c had risen from 5.3% to 6.3%. I recommended he increase his metformin to 500 mg twice a day at that time.  Urinary symptoms much improved on oxybutynin.  He does feel like he has room for improvement, though. No problems taking metformin twice a day. He has not checked any glucoses since his increase in dosing.  He has a history of smoking and dipping.  He quit using Chantix at one point. He started vaping and wants to try Chantix once again to quit this.  ROS as above, plus--> no fevers, no CP, no SOB, no wheezing, no cough, no dizziness, no HAs, no rashes, no melena/hematochezia.  No polydipsia.  No myalgias or arthralgias.  No focal weakness, paresthesias, or tremors.  No acute vision or hearing abnormalities.  No recent changes in lower legs. No n/v/d or abd pain.  No palpitations.     Past Medical History:  Diagnosis Date   Alcoholism in recovery [facility])    quit 08/2019 (rehab)   Allergic rhinitis    Anxiety and depression    Hx of-->when in recovery period from alcoholism   Diabetes mellitus without complication (HCC)    Elevated liver enzymes    hx of, when he was drinking ETOH heavily   GERD (gastroesophageal reflux disease)    resolved with Nissen/HH surgery 2022   History of pneumonia    Hyperlipidemia    Hypertension     Past Surgical History:  Procedure Laterality Date   ESOPHAGOGASTRODUODENOSCOPY     2021 [facility]   HIATAL HERNIA REPAIR N/A 04/07/2021   Procedure: LAPAROSCOPIC HIATAL HERNIA REPAIR WITH NISSEN FUNDOPLICATION AND GASTRORRHAPHY;  Surgeon: Greer Pickerel, MD;   Location: [facility];  Service: General;  Laterality: N/A;   left leg surgery     SINUSOTOMY     TONSILLECTOMY AND ADENOIDECTOMY  1995   UPPER GI ENDOSCOPY  04/07/2021   Procedure: UPPER GI ENDOSCOPY;  Surgeon: Greer Pickerel, MD;  Location: [facility];  Service: General;;   WRIST FRACTURE SURGERY      Outpatient Medications Prior to Visit  Medication Sig Dispense Refill   Azelastine-Fluticasone 137-50 MCG/ACT SUSP Place 1-2 sprays into the nose 2 (two) times daily.     fenofibrate 160 MG tablet Take 1 tablet (160 mg total) by mouth daily. 90 tablet 1   fluticasone (FLONASE) 50 MCG/ACT nasal spray SPRAY 2 SPRAYS INTO EACH NOSTRIL EVERY DAY 16 mL 11   hydrochlorothiazide (MICROZIDE) 12.5 MG capsule Take 1 capsule (12.5 mg total) by mouth daily. 90 capsule 1   levocetirizine (XYZAL) 5 MG tablet TAKE 1 TABLET BY MOUTH EVERY DAY IN THE EVENING 90 tablet 1   losartan (COZAAR) 25 MG tablet Take 1 tablet (25 mg total) by mouth daily. 90 tablet 1   metFORMIN (GLUCOPHAGE-XR) 500 MG 24 hr tablet TAKE 1 TABLET BY MOUTH TWICE DAILY. 180 tablet 1   OVER THE COUNTER MEDICATION Take 1 Scoop by mouth daily. Athletic Greens     oxybutynin (DITROPAN-XL) 5 MG 24 hr tablet TAKE 1 TABLET BY MOUTH EVERYDAY AT BEDTIME 30 tablet  0   No facility-administered medications prior to visit.    Allergies  Allergen Reactions   Benzonatate Other (See Comments)    Restless legs    Promethazine Other (See Comments)    Restless legs Restless legs    Review of Systems As per HPI  PE:    09/28/2022    1:20 PM 08/09/2022    7:57 AM 01/27/2022    2:37 PM  Vitals with BMI  Height 6' 1.75" 6' 1.75" '6\' 2"'$   Weight 181 lbs 184 lbs 202 lbs 6 oz  BMI 23.4 22.02 54.27  Systolic 062 376 283  Diastolic 74 62 68  Pulse 63 54 66     Physical Exam  Gen: Alert, well appearing.  Patient is oriented to person, place, time, and situation. AFFECT: pleasant, lucid thought and speech. No further exam today  LABS:  Last CBC Lab  Results  Component Value Date   WBC 6.0 08/09/2022   HGB 13.6 08/09/2022   HCT 41.0 08/09/2022   MCV 90.2 08/09/2022   MCH 29.1 04/08/2021   RDW 13.6 08/09/2022   PLT 213.0 15/17/6160   Last metabolic panel Lab Results  Component Value Date   GLUCOSE 98 08/09/2022   NA 135 08/09/2022   K 4.1 08/09/2022   CL 97 08/09/2022   CO2 32 08/09/2022   BUN 17 08/09/2022   CREATININE 1.05 08/09/2022   GFRNONAA >60 04/08/2021   CALCIUM 9.6 08/09/2022   PROT 7.0 08/09/2022   ALBUMIN 4.8 08/09/2022   BILITOT 0.5 08/09/2022   ALKPHOS 30 (L) 08/09/2022   AST 20 08/09/2022   ALT 45 08/09/2022   ANIONGAP 7 04/08/2021   Lab Results  Component Value Date   CHOL 177 08/09/2022   HDL 88.00 08/09/2022   LDLCALC 77 08/09/2022   LDLDIRECT 83.0 05/12/2020   TRIG 57.0 08/09/2022   CHOLHDL 2 08/09/2022   Lab Results  Component Value Date   HGBA1C 6.3 08/09/2022   IMPRESSION AND PLAN:  #1 overactive bladder, good response to oxybutynin XL 5 mg a day. Room for improvement.  No side effects noted. Increase to 10 mg daily dose.  2.  Diabetes without complication. Last A1c was up just a little bit.  We increased metformin to 500 twice daily and he is doing fine with this. He will check his sugar occasionally, goal fasting less than 100. Plan repeat A1c in about 6 weeks.  #3 nicotine dependence. We discussed the fact that this could also stimulate his overactive bladder. Will restart Chantix because this helped with smoking cessation in the past.  An After Visit Summary was printed and given to the patient.  FOLLOW UP: Return in about 6 weeks (around 11/09/2022) for Follow-up urinary, diabetes, Chantix.  Signed:  Crissie Sickles, MD           09/28/2022

## 2022-10-21 ENCOUNTER — Other Ambulatory Visit: Payer: Self-pay | Admitting: Family Medicine

## 2022-10-23 ENCOUNTER — Other Ambulatory Visit: Payer: Self-pay | Admitting: Family Medicine

## 2022-11-08 ENCOUNTER — Encounter: Payer: Self-pay | Admitting: Family Medicine

## 2022-11-09 ENCOUNTER — Ambulatory Visit (INDEPENDENT_AMBULATORY_CARE_PROVIDER_SITE_OTHER): Payer: BC Managed Care – PPO | Admitting: Family Medicine

## 2022-11-09 ENCOUNTER — Encounter: Payer: Self-pay | Admitting: Family Medicine

## 2022-11-09 VITALS — BP 126/74 | HR 55 | Ht 73.75 in | Wt 186.0 lb

## 2022-11-09 DIAGNOSIS — F17290 Nicotine dependence, other tobacco product, uncomplicated: Secondary | ICD-10-CM

## 2022-11-09 DIAGNOSIS — J3089 Other allergic rhinitis: Secondary | ICD-10-CM | POA: Diagnosis not present

## 2022-11-09 DIAGNOSIS — J3081 Allergic rhinitis due to animal (cat) (dog) hair and dander: Secondary | ICD-10-CM | POA: Diagnosis not present

## 2022-11-09 DIAGNOSIS — N3281 Overactive bladder: Secondary | ICD-10-CM | POA: Diagnosis not present

## 2022-11-09 DIAGNOSIS — J301 Allergic rhinitis due to pollen: Secondary | ICD-10-CM | POA: Diagnosis not present

## 2022-11-09 DIAGNOSIS — E119 Type 2 diabetes mellitus without complications: Secondary | ICD-10-CM | POA: Diagnosis not present

## 2022-11-09 LAB — POCT GLYCOSYLATED HEMOGLOBIN (HGB A1C)
HbA1c POC (<> result, manual entry): 5.3 % (ref 4.0–5.6)
HbA1c, POC (controlled diabetic range): 5.3 % (ref 0.0–7.0)
HbA1c, POC (prediabetic range): 5.3 % — AB (ref 5.7–6.4)
Hemoglobin A1C: 5.3 % (ref 4.0–5.6)

## 2022-11-09 MED ORDER — FENOFIBRATE 160 MG PO TABS: 160.0000 mg | ORAL_TABLET | Freq: Every day | ORAL | 3 refills | Status: DC

## 2022-11-09 MED ORDER — HYDROCHLOROTHIAZIDE 12.5 MG PO CAPS: 12.5000 mg | ORAL_CAPSULE | Freq: Every day | ORAL | 3 refills | Status: DC

## 2022-11-09 MED ORDER — OXYBUTYNIN CHLORIDE ER 10 MG PO TB24: 10.0000 mg | ORAL_TABLET | Freq: Every day | ORAL | 3 refills | Status: DC

## 2022-11-09 NOTE — Progress Notes (Signed)
OFFICE VISIT  11/09/2022  CC:  Chief Complaint  Patient presents with   Medical Management of Chronic Issues    Last A1c 11/15 (6.3)    Patient is a 47 y.o. male who presents for 6-week follow-up overactive bladder, diabetes, and nicotine dependence. A/P as of last visit: "#1 overactive bladder, good response to oxybutynin XL 5 mg a day. Room for improvement.  No side effects noted. Increase to 10 mg daily dose.   2.  Diabetes without complication. Last A1c was up just a little bit.  We increased metformin to 500 twice daily and he is doing fine with this. He will check his sugar occasionally, goal fasting less than 100. Plan repeat A1c in about 6 weeks.   #3 nicotine dependence. We discussed the fact that this could also stimulate his overactive bladder. Will restart Chantix because this helped with smoking cessation in the past."  INTERIM HX: Kyle [name] is doing very well. A month ago he changed his diet to largely plant-based, some chicken--- he noted significant improvement in his fasting glucose from an average of around 110 down to an average of 80s. No problems taking the metformin 500 twice a day.  His urinary urgency and frequency has improved significantly since getting on oxybutynin. Sleeps till 5 AM without needing to urinate.  He has done well on Chantix, has not done any vaping in the last few weeks.  ROS as above, plus--> no fevers, no CP, no SOB, no wheezing, no cough, no dizziness, no HAs, no rashes, no melena/hematochezia.  No polyuria or polydipsia.  No myalgias or arthralgias.  No focal weakness, paresthesias, or tremors.  No acute vision or hearing abnormalities.  No dysuria or unusual/new urinary urgency or frequency.  No recent changes in lower legs. No n/v/d or abd pain.  No palpitations.     Past Medical History:  Diagnosis Date   Alcoholism in recovery [facility])    quit 08/2019 (rehab)   Allergic rhinitis    Anxiety and depression    Hx of-->when in  recovery period from alcoholism   Diabetes mellitus without complication (HCC)    Elevated liver enzymes    hx of, when he was drinking ETOH heavily   GERD (gastroesophageal reflux disease)    resolved with Nissen/HH surgery 2022   History of pneumonia    Hyperlipidemia    Hypertension     Past Surgical History:  Procedure Laterality Date   ESOPHAGOGASTRODUODENOSCOPY     2021 [facility]   HIATAL HERNIA REPAIR N/A 04/07/2021   Procedure: LAPAROSCOPIC HIATAL HERNIA REPAIR WITH NISSEN FUNDOPLICATION AND GASTRORRHAPHY;  Surgeon: Greer Pickerel, MD;  Location: [facility];  Service: General;  Laterality: N/A;   left leg surgery     SINUSOTOMY     TONSILLECTOMY AND ADENOIDECTOMY  1995   UPPER GI ENDOSCOPY  04/07/2021   Procedure: UPPER GI ENDOSCOPY;  Surgeon: Greer Pickerel, MD;  Location: [facility];  Service: General;;   WRIST FRACTURE SURGERY      Outpatient Medications Prior to Visit  Medication Sig Dispense Refill   Azelastine-Fluticasone 137-50 MCG/ACT SUSP Place 1-2 sprays into the nose 2 (two) times daily.     fluticasone (FLONASE) 50 MCG/ACT nasal spray SPRAY 2 SPRAYS INTO EACH NOSTRIL EVERY DAY 16 mL 11   levocetirizine (XYZAL) 5 MG tablet TAKE 1 TABLET BY MOUTH EVERY DAY IN THE EVENING 90 tablet 1   losartan (COZAAR) 25 MG tablet Take 1 tablet (25 mg total) by mouth  daily. 90 tablet 1   metFORMIN (GLUCOPHAGE-XR) 500 MG 24 hr tablet TAKE 1 TABLET BY MOUTH TWICE DAILY. 180 tablet 1   varenicline (CHANTIX) 1 MG tablet 1 tab p.o. twice daily 180 tablet 0   fenofibrate 160 MG tablet TAKE 1 TABLET BY MOUTH EVERY DAY 30 tablet 0   hydrochlorothiazide (MICROZIDE) 12.5 MG capsule TAKE 1 CAPSULE BY MOUTH EVERY DAY 30 capsule 0   oxybutynin (DITROPAN XL) 10 MG 24 hr tablet Take 1 tablet (10 mg total) by mouth at bedtime. 30 tablet 1   No facility-administered medications prior to visit.    Allergies  Allergen Reactions   Benzonatate Other (See Comments)    Restless legs     Promethazine Other (See Comments)    Restless legs Restless legs    Review of Systems As per HPI  PE:    11/09/2022   12:59 PM 09/28/2022    1:20 PM 08/09/2022    7:57 AM  Vitals with BMI  Height 6' 1.75" 6' 1.75" 6' 1.75"  Weight 186 lbs 181 lbs 184 lbs  BMI 24.05 0000000 [id number]  Systolic [id number]  Diastolic 74 74 62  Pulse 55 63 54     Physical Exam  Gen: Alert, well appearing.  Patient is oriented to person, place, time, and situation. AFFECT: pleasant, lucid thought and speech. No further exam today  LABS:  Last CBC Lab Results  Component Value Date   WBC 6.0 08/09/2022   HGB 13.6 08/09/2022   HCT 41.0 08/09/2022   MCV 90.2 08/09/2022   MCH 29.1 04/08/2021   RDW 13.6 08/09/2022   PLT 213.0 99991111   Last metabolic panel Lab Results  Component Value Date   GLUCOSE 98 08/09/2022   NA 135 08/09/2022   K 4.1 08/09/2022   CL 97 08/09/2022   CO2 32 08/09/2022   BUN 17 08/09/2022   CREATININE 1.05 08/09/2022   GFRNONAA >60 04/08/2021   CALCIUM 9.6 08/09/2022   PROT 7.0 08/09/2022   ALBUMIN 4.8 08/09/2022   BILITOT 0.5 08/09/2022   ALKPHOS 30 (L) 08/09/2022   AST 20 08/09/2022   ALT 45 08/09/2022   ANIONGAP 7 04/08/2021   Last lipids Lab Results  Component Value Date   CHOL 177 08/09/2022   HDL 88.00 08/09/2022   LDLCALC 77 08/09/2022   LDLDIRECT 83.0 05/12/2020   TRIG 57.0 08/09/2022   CHOLHDL 2 08/09/2022   Last hemoglobin A1c Lab Results  Component Value Date   HGBA1C 5.3 11/09/2022   HGBA1C 5.3 11/09/2022   HGBA1C 5.3 (A) 11/09/2022   HGBA1C 5.3 11/09/2022   Last thyroid functions Lab Results  Component Value Date   TSH 1.23 08/09/2022   IMPRESSION AND PLAN:  #1 diabetes without complication.  Excellent control. POC Hba1c today is 5.3%! Continue excellent dietary changes, great exercise habits, and metformin XR 500 mg twice a day.  #2 overactive bladder.  Great response to oxybutynin XL 10 mg nightly.  3.  Nicotine  dependence. He is doing great on Chantix, no vaping in 3 weeks. Plans to finish a full 3 month course of Chantix.  An After Visit Summary was printed and given to the patient.  FOLLOW UP: Return in about 4 months (around 03/10/2023) for routine chronic illness f/u. Next cpe 07/2023  Signed:  Crissie Sickles, MD           11/09/2022

## 2022-12-23 ENCOUNTER — Other Ambulatory Visit: Payer: Self-pay | Admitting: Family Medicine

## 2022-12-28 DIAGNOSIS — J301 Allergic rhinitis due to pollen: Secondary | ICD-10-CM | POA: Diagnosis not present

## 2022-12-28 DIAGNOSIS — J3089 Other allergic rhinitis: Secondary | ICD-10-CM | POA: Diagnosis not present

## 2022-12-28 DIAGNOSIS — J3081 Allergic rhinitis due to animal (cat) (dog) hair and dander: Secondary | ICD-10-CM | POA: Diagnosis not present

## 2023-01-04 DIAGNOSIS — J3081 Allergic rhinitis due to animal (cat) (dog) hair and dander: Secondary | ICD-10-CM | POA: Diagnosis not present

## 2023-01-04 DIAGNOSIS — J3089 Other allergic rhinitis: Secondary | ICD-10-CM | POA: Diagnosis not present

## 2023-01-04 DIAGNOSIS — J301 Allergic rhinitis due to pollen: Secondary | ICD-10-CM | POA: Diagnosis not present

## 2023-01-11 DIAGNOSIS — J301 Allergic rhinitis due to pollen: Secondary | ICD-10-CM | POA: Diagnosis not present

## 2023-01-11 DIAGNOSIS — J3089 Other allergic rhinitis: Secondary | ICD-10-CM | POA: Diagnosis not present

## 2023-01-11 DIAGNOSIS — J3081 Allergic rhinitis due to animal (cat) (dog) hair and dander: Secondary | ICD-10-CM | POA: Diagnosis not present

## 2023-01-16 ENCOUNTER — Other Ambulatory Visit: Payer: Self-pay | Admitting: Family Medicine

## 2023-01-18 DIAGNOSIS — J301 Allergic rhinitis due to pollen: Secondary | ICD-10-CM | POA: Diagnosis not present

## 2023-01-18 DIAGNOSIS — J3089 Other allergic rhinitis: Secondary | ICD-10-CM | POA: Diagnosis not present

## 2023-01-18 DIAGNOSIS — J3081 Allergic rhinitis due to animal (cat) (dog) hair and dander: Secondary | ICD-10-CM | POA: Diagnosis not present

## 2023-01-22 ENCOUNTER — Telehealth: Payer: BC Managed Care – PPO | Admitting: Physician Assistant

## 2023-01-22 ENCOUNTER — Telehealth: Payer: BC Managed Care – PPO | Admitting: Nurse Practitioner

## 2023-01-22 ENCOUNTER — Other Ambulatory Visit: Payer: Self-pay | Admitting: Family Medicine

## 2023-01-22 DIAGNOSIS — S70369A Insect bite (nonvenomous), unspecified thigh, initial encounter: Secondary | ICD-10-CM

## 2023-01-22 DIAGNOSIS — M546 Pain in thoracic spine: Secondary | ICD-10-CM

## 2023-01-22 MED ORDER — CYCLOBENZAPRINE HCL 10 MG PO TABS: 10.0000 mg | ORAL_TABLET | Freq: Three times a day (TID) | ORAL | 0 refills | Status: DC | PRN

## 2023-01-22 MED ORDER — NAPROXEN 500 MG PO TABS: 500.0000 mg | ORAL_TABLET | Freq: Two times a day (BID) | ORAL | 0 refills | Status: DC

## 2023-01-22 MED ORDER — DOXYCYCLINE HYCLATE 100 MG PO TABS: 100.0000 mg | ORAL_TABLET | Freq: Two times a day (BID) | ORAL | 0 refills | Status: AC

## 2023-01-22 NOTE — Progress Notes (Signed)
I have spent 5 minutes in review of e-visit questionnaire, review and updating patient chart, medical decision making and response to patient.   Mekel Haverstock Cody Dalyce Renne, PA-C    

## 2023-01-22 NOTE — Progress Notes (Signed)

## 2023-01-22 NOTE — Progress Notes (Signed)
E-Visit for Tick Bite  Thank you for describing your tick bite, Here is how we plan to help! Based on the information that you shared with me it looks like you have A tick that bite that we will treat with a short course of doxycycline.  In most cases a tick bite is painless and does not itch.  Most tick bites in which the tick is quickly removed do not require prescriptions. Ticks can transmit several diseases if they are infected and remain attacked to your skin. Therefore the length that the tick was attached and any symptoms you have experienced after the bite are import to accurately develop your custom treatment plan. In most cases a single dose of doxycycline may prevent the development of a more serious condition.  Based on your information I have Provided a home care guide for tick bites and  instructions on when to call for help. and Your symptoms indicate that you need a longer course of antibiotics and a follow up visit with a provider. I have sent doxycycline 100 mg twice a day for 21 days to the pharmacy that you selected. You will need to schedule a follow up visit with your provider. If you do not have a primary care provider you may use our telehealth physicians on the web at [email or web address]   Follow up with your primary care provider to assure no lab testing is needed, based on how long the tick was possibly attached and if there is any rash that occurs after the bite  Which ticks  are associated with illness?  The Wood Tick (dog tick) is the size of a watermelon seed and can sometimes transmit [facility] spotted fever and [place] tick fever.   The Deer Tick (black-legged tick) is between the size of a poppy seed (pin head) and an apple seed, and can sometimes transmit Lyme disease.  A brown to black tick with a white splotch on its back is likely a male Amblyomma americanum ([place] tick). This tick has been associated with Southern Tick Associated illness (  STARI)  Lyme disease has become the most common tick-borne illness in the [place]. The risk of Lyme disease following a recognized deer tick bite is estimated to be 1%.  The majority of cases of Lyme disease start with a bull's eye rash at the site of the tick bite. The rash can occur days to weeks (typically 7-10 days) after a tick bite. Treatment with antibiotics is indicated if this rash appears. Flu-like symptoms may accompany the rash, including: fever, chills, headaches, muscle aches, and fatigue. Removing ticks promptly may prevent tick borne disease.  What can be used to prevent Tick Bites?  Insect repellant with at leas 20% DEET. Wearing long pants with sock and shoes. Avoiding tall grass and heavily wooded areas. Checking your skin after being outdoors. Shower with a washcloth after outdoor exposures.  HOME CARE ADVICE FOR TICK BITE  Wood Tick Removal:  Use a pair of tweezers and grasp the wood tick close to the skin (on its head). Pull the wood tick straight upward without twisting or crushing it. Maintain a steady pressure until it releases its grip.   If tweezers aren't available, use fingers, a loop of thread around the jaws, or a needle between the jaws for traction.  Note: covering the tick with petroleum jelly, nail polish or rubbing alcohol doesn't work. Neither does touching the tick with a hot or cold object. Tiny Deer Tick Removal:  Needs to be scraped off with a knife blade or credit card edge. Place tick in a sealed container (e.g. glass jar, zip lock plastic bag), in case your doctor wants to see it. Tick's Head Removal:  If the wood tick's head breaks off in the skin, it must be removed. Clean the skin. Then use a sterile needle to uncover the head and lift it out or scrape it off.  If a very small piece of the head remains, the skin will eventually slough it off. Antibiotic Ointment:  Wash the wound and your hands with soap and water after removal to  prevent catching any tick disease.  Apply an over the counter antibiotic ointment (e.g. bacitracin) to the bite once. Expected Course: Tick bites normally don't itch or hurt. That's why they often go unnoticed. Call Your Doctor If:  You can't remove the tick or the tick's head Fever, a severe head ache, or rash occur in the next 2 weeks Bite begins to look infected Lyme's disease is common in your area You have not had a tetanus in the last 10 years Your current symptoms become worse    MAKE SURE YOU  Understand these instructions. Will watch your condition. Will get help right away if you are not doing well or get worse.    Thank you for choosing an e-visit.  Your e-visit answers were reviewed by a board certified advanced clinical practitioner to complete your personal care plan. Depending upon the condition, your plan could have included both over the counter or prescription medications.  Please review your pharmacy choice. Make sure the pharmacy is open so you can pick up prescription now. If there is a problem, you may contact your provider through [employer] and have the prescription routed to another pharmacy.  Your safety is important to [place]. If you have drug allergies check your prescription carefully.   For the next 24 hours you can use MyChart to ask questions about today's visit, request a non-urgent call back, or ask for a work or school excuse. You will get an email in the next two days asking about your experience. I hope that your e-visit has been valuable and will speed your recovery.   Meds ordered this encounter  Medications   doxycycline (VIBRA-TABS) 100 MG tablet    Sig: Take 1 tablet (100 mg total) by mouth 2 (two) times daily for 21 days.    Dispense:  42 tablet    Refill:  0     I spent approximately 5 minutes reviewing the patient's history, current symptoms and coordinating their care today.

## 2023-01-25 DIAGNOSIS — J3089 Other allergic rhinitis: Secondary | ICD-10-CM | POA: Diagnosis not present

## 2023-01-25 DIAGNOSIS — J3081 Allergic rhinitis due to animal (cat) (dog) hair and dander: Secondary | ICD-10-CM | POA: Diagnosis not present

## 2023-01-25 DIAGNOSIS — J301 Allergic rhinitis due to pollen: Secondary | ICD-10-CM | POA: Diagnosis not present

## 2023-02-01 DIAGNOSIS — J3081 Allergic rhinitis due to animal (cat) (dog) hair and dander: Secondary | ICD-10-CM | POA: Diagnosis not present

## 2023-02-01 DIAGNOSIS — J3089 Other allergic rhinitis: Secondary | ICD-10-CM | POA: Diagnosis not present

## 2023-02-01 DIAGNOSIS — J301 Allergic rhinitis due to pollen: Secondary | ICD-10-CM | POA: Diagnosis not present

## 2023-02-02 DIAGNOSIS — J301 Allergic rhinitis due to pollen: Secondary | ICD-10-CM | POA: Diagnosis not present

## 2023-02-02 DIAGNOSIS — J3081 Allergic rhinitis due to animal (cat) (dog) hair and dander: Secondary | ICD-10-CM | POA: Diagnosis not present

## 2023-02-02 DIAGNOSIS — J3089 Other allergic rhinitis: Secondary | ICD-10-CM | POA: Diagnosis not present

## 2023-02-08 DIAGNOSIS — J3081 Allergic rhinitis due to animal (cat) (dog) hair and dander: Secondary | ICD-10-CM | POA: Diagnosis not present

## 2023-02-08 DIAGNOSIS — J3089 Other allergic rhinitis: Secondary | ICD-10-CM | POA: Diagnosis not present

## 2023-02-08 DIAGNOSIS — J301 Allergic rhinitis due to pollen: Secondary | ICD-10-CM | POA: Diagnosis not present

## 2023-02-15 DIAGNOSIS — J3089 Other allergic rhinitis: Secondary | ICD-10-CM | POA: Diagnosis not present

## 2023-02-15 DIAGNOSIS — J3081 Allergic rhinitis due to animal (cat) (dog) hair and dander: Secondary | ICD-10-CM | POA: Diagnosis not present

## 2023-02-15 DIAGNOSIS — J301 Allergic rhinitis due to pollen: Secondary | ICD-10-CM | POA: Diagnosis not present

## 2023-02-17 ENCOUNTER — Other Ambulatory Visit: Payer: Self-pay | Admitting: Family Medicine

## 2023-03-02 ENCOUNTER — Other Ambulatory Visit: Payer: Self-pay | Admitting: Family Medicine

## 2023-03-09 NOTE — Patient Instructions (Signed)

## 2023-03-12 ENCOUNTER — Encounter: Payer: Self-pay | Admitting: Family Medicine

## 2023-03-12 ENCOUNTER — Ambulatory Visit (INDEPENDENT_AMBULATORY_CARE_PROVIDER_SITE_OTHER): Payer: BC Managed Care – PPO | Admitting: Family Medicine

## 2023-03-12 VITALS — BP 128/75 | HR 71 | Wt 181.6 lb

## 2023-03-12 DIAGNOSIS — I10 Essential (primary) hypertension: Secondary | ICD-10-CM

## 2023-03-12 DIAGNOSIS — E119 Type 2 diabetes mellitus without complications: Secondary | ICD-10-CM | POA: Diagnosis not present

## 2023-03-12 DIAGNOSIS — E782 Mixed hyperlipidemia: Secondary | ICD-10-CM | POA: Diagnosis not present

## 2023-03-12 DIAGNOSIS — Z7984 Long term (current) use of oral hypoglycemic drugs: Secondary | ICD-10-CM

## 2023-03-12 LAB — LIPID PANEL
Cholesterol: 147 mg/dL (ref 0–200)
HDL: 83.5 mg/dL (ref >39.00)
LDL Cholesterol: 51 mg/dL (ref 0–99)
NonHDL: 63.02
Total CHOL/HDL Ratio: 2
Triglycerides: 59 mg/dL (ref 0.0–149.0)
VLDL: 11.8 mg/dL (ref 0.0–40.0)

## 2023-03-12 LAB — COMPREHENSIVE METABOLIC PANEL
ALT: 22 U/L (ref 0–53)
AST: 22 U/L (ref 0–37)
Albumin: 4.8 g/dL (ref 3.5–5.2)
Alkaline Phosphatase: 28 U/L — ABNORMAL LOW (ref 39–117)
BUN: 18 mg/dL (ref 6–23)
CO2: 28 mEq/L (ref 19–32)
Calcium: 9.9 mg/dL (ref 8.4–10.5)
Chloride: 102 mEq/L (ref 96–112)
Creatinine, Ser: 1.04 mg/dL (ref 0.40–1.50)
GFR: 85.35 mL/min (ref >60.00)
Glucose, Bld: 101 mg/dL — ABNORMAL HIGH (ref 70–99)
Potassium: 4.2 mEq/L (ref 3.5–5.1)
Sodium: 138 mEq/L (ref 135–145)
Total Bilirubin: 0.6 mg/dL (ref 0.2–1.2)
Total Protein: 7.4 g/dL (ref 6.0–8.3)

## 2023-03-12 LAB — POCT GLYCOSYLATED HEMOGLOBIN (HGB A1C)
HbA1c POC (<> result, manual entry): 5.4 % (ref 4.0–5.6)
HbA1c, POC (controlled diabetic range): 5.4 % (ref 0.0–7.0)
HbA1c, POC (prediabetic range): 5.4 % — AB (ref 5.7–6.4)
Hemoglobin A1C: 5.4 % (ref 4.0–5.6)

## 2023-03-12 MED ORDER — METFORMIN HCL ER 500 MG PO TB24: ORAL_TABLET | ORAL | 1 refills | Status: DC

## 2023-03-12 MED ORDER — LEVOCETIRIZINE DIHYDROCHLORIDE 5 MG PO TABS: ORAL_TABLET | ORAL | 1 refills | Status: DC

## 2023-03-12 NOTE — Progress Notes (Signed)
OFFICE VISIT  03/12/2023  CC:  Chief Complaint  Patient presents with   Diabetes    Patient is a 47 y.o. male who presents for 21-month follow-up diabetes, hypertension, and hyperlipidemia.   A/P as of last visit: "1 diabetes without complication.  Excellent control. POC Hba1c today is 5.3%! Continue excellent dietary changes, great exercise habits, and metformin XR 500 mg twice a day.   #2 overactive bladder.  Great response to oxybutynin XL 10 mg nightly.   3.  Nicotine dependence. He is doing great on Chantix, no vaping in 3 weeks. Plans to finish a full 3 month course of Chantix."  INTERIM HX: Severe anxiety in the last month or so.  Lots of thoughts about the afterlife, regrets about his life, ruminating depression.  Waking up at 3 AM every night and cannot go back to sleep.  Has near panic attacks regularly.  No alcohol. He has started seeing a mental health provider through his employer based medical program. He has been started on buspirone 7.5 mg a day and Zoloft 50 mg a day.  He goes on long hikes and camping trips by himself.  He has some overuse pain in his legs and feet.  He has not taken a long hike in a whole month due to this pain.   No burning, tingling, or numbness in the feet.  Past Medical History:  Diagnosis Date   Alcoholism in recovery [facility])    quit 08/2019 (rehab)   Allergic rhinitis    Anxiety and depression    Hx of-->when in recovery period from alcoholism   Diabetes mellitus without complication (HCC)    Elevated liver enzymes    hx of, when he was drinking ETOH heavily   GERD (gastroesophageal reflux disease)    resolved with Nissen/HH surgery 2022   History of pneumonia    Hyperlipidemia    Hypertension     Past Surgical History:  Procedure Laterality Date   ESOPHAGOGASTRODUODENOSCOPY     2021 [facility]   HIATAL HERNIA REPAIR N/A 04/07/2021   Procedure: LAPAROSCOPIC HIATAL HERNIA REPAIR WITH NISSEN FUNDOPLICATION  AND GASTRORRHAPHY;  Surgeon: Gaynelle Adu, MD;  Location: [facility];  Service: General;  Laterality: N/A;   left leg surgery     SINUSOTOMY     TONSILLECTOMY AND ADENOIDECTOMY  1995   UPPER GI ENDOSCOPY  04/07/2021   Procedure: UPPER GI ENDOSCOPY;  Surgeon: Gaynelle Adu, MD;  Location: [facility];  Service: General;;   WRIST FRACTURE SURGERY      Outpatient Medications Prior to Visit  Medication Sig Dispense Refill   busPIRone (BUSPAR) 7.5 MG tablet Take 7.5 mg by mouth daily.     fenofibrate 160 MG tablet Take 1 tablet (160 mg total) by mouth daily. 90 tablet 3   fluticasone (FLONASE) 50 MCG/ACT nasal spray SPRAY 2 SPRAYS INTO EACH NOSTRIL EVERY DAY 16 mL 11   hydrochlorothiazide (MICROZIDE) 12.5 MG capsule Take 1 capsule (12.5 mg total) by mouth daily. 90 capsule 3   losartan (COZAAR) 25 MG tablet TAKE 1 TABLET (25 MG TOTAL) BY MOUTH DAILY. 30 tablet 0   naproxen (NAPROSYN) 500 MG tablet Take 1 tablet (500 mg total) by mouth 2 (two) times daily with a meal. 30 tablet 0   oxybutynin (DITROPAN-XL) 10 MG 24 hr tablet TAKE 1 TABLET BY MOUTH EVERYDAY AT BEDTIME 30 tablet 1   sertraline (ZOLOFT) 50 MG tablet Take 50 mg by mouth daily.     Azelastine-Fluticasone 137-50  MCG/ACT SUSP Place 1-2 sprays into the nose 2 (two) times daily. (Patient not taking: Reported on 03/12/2023)     cyclobenzaprine (FLEXERIL) 10 MG tablet Take 1 tablet (10 mg total) by mouth 3 (three) times daily as needed for muscle spasms. 15 tablet 0   levocetirizine (XYZAL) 5 MG tablet TAKE 1 TABLET BY MOUTH EVERY DAY IN THE EVENING 90 tablet 1   metFORMIN (GLUCOPHAGE-XR) 500 MG 24 hr tablet TAKE 1 TABLET BY MOUTH TWICE A DAY 60 tablet 1   varenicline (CHANTIX) 1 MG tablet TAKE 1 TABLET BY MOUTH TWICE A DAY 60 tablet 1   No facility-administered medications prior to visit.    Allergies  Allergen Reactions   Benzonatate Other (See Comments)    Restless legs    Promethazine Other (See Comments)    Restless legs Restless legs     Review of Systems As per HPI  PE:    03/12/2023    8:12 AM 11/09/2022   12:59 PM 09/28/2022    1:20 PM  Vitals with BMI  Height  6' 1.75" 6' 1.75"  Weight 181 lbs 10 oz 186 lbs 181 lbs  BMI  24.05 23.4  Systolic 128 126 161  Diastolic 75 74 74  Pulse 71 55 63     Physical Exam  Gen: Alert, well appearing.  Patient is oriented to person, place, time, and situation. AFFECT: Anxious but pleasant, lucid thought and speech. Foot exam - no swelling, tenderness or skin or vascular lesions. Color and temperature is normal. Sensation is intact. Peripheral pulses are palpable. Toenails are normal.   LABS:  Last CBC Lab Results  Component Value Date   WBC 6.0 08/09/2022   HGB 13.6 08/09/2022   HCT 41.0 08/09/2022   MCV 90.2 08/09/2022   MCH 29.1 04/08/2021   RDW 13.6 08/09/2022   PLT 213.0 08/09/2022   Last metabolic panel Lab Results  Component Value Date   GLUCOSE 98 08/09/2022   NA 135 08/09/2022   K 4.1 08/09/2022   CL 97 08/09/2022   CO2 32 08/09/2022   BUN 17 08/09/2022   CREATININE 1.05 08/09/2022   GFRNONAA >60 04/08/2021   CALCIUM 9.6 08/09/2022   PROT 7.0 08/09/2022   ALBUMIN 4.8 08/09/2022   BILITOT 0.5 08/09/2022   ALKPHOS 30 (L) 08/09/2022   AST 20 08/09/2022   ALT 45 08/09/2022   ANIONGAP 7 04/08/2021   Last lipids Lab Results  Component Value Date   CHOL 177 08/09/2022   HDL 88.00 08/09/2022   LDLCALC 77 08/09/2022   LDLDIRECT 83.0 05/12/2020   TRIG 57.0 08/09/2022   CHOLHDL 2 08/09/2022   Last hemoglobin A1c Lab Results  Component Value Date   HGBA1C 5.4 03/12/2023   HGBA1C 5.4 03/12/2023   HGBA1C 5.4 (A) 03/12/2023   HGBA1C 5.4 03/12/2023   Last thyroid functions Lab Results  Component Value Date   TSH 1.23 08/09/2022   IMPRESSION AND PLAN:  #1 diabetes without complication. Excellent control. POC Hba1c today is 5.4%. Feet exam normal today. Continue metformin XR 500 twice daily.  #2 hypertriglyceridemia, has been doing  well on fenofibrate 160 mg a day. Lipid panel and hepatic panel today.  #3 GAD and recurrent major depressive disorder. He is improving a little bit with therapy as well as sertraline and buspirone. He is getting followed by a psychiatrist and a counselor through his employer based medical program. No changes today.  #4 hypertension, well-controlled on HCTZ 12.5 mg a day and losartan  25 mg a day. Electrolytes and creatinine today.  # leg and feet pain.  Suspicious for overuse injury but we did not get to discuss this in depth today.  He will make a return visit to evaluate this.  An After Visit Summary was printed and given to the patient.  FOLLOW UP: Return for appt at your convenience to address pain in legs.  Otherwise 3 month f/u DM. Next CPE 07/2023 Signed:  Santiago Bumpers, MD           03/12/2023

## 2023-03-25 ENCOUNTER — Other Ambulatory Visit: Payer: Self-pay | Admitting: Family Medicine

## 2023-04-02 DIAGNOSIS — F3181 Bipolar II disorder: Secondary | ICD-10-CM | POA: Insufficient documentation

## 2023-04-12 DIAGNOSIS — J3089 Other allergic rhinitis: Secondary | ICD-10-CM | POA: Diagnosis not present

## 2023-04-12 DIAGNOSIS — J301 Allergic rhinitis due to pollen: Secondary | ICD-10-CM | POA: Diagnosis not present

## 2023-04-12 DIAGNOSIS — J3081 Allergic rhinitis due to animal (cat) (dog) hair and dander: Secondary | ICD-10-CM | POA: Diagnosis not present

## 2023-04-15 ENCOUNTER — Encounter: Payer: Self-pay | Admitting: Family Medicine

## 2023-04-16 ENCOUNTER — Ambulatory Visit: Payer: BC Managed Care – PPO | Admitting: Family Medicine

## 2023-04-19 ENCOUNTER — Encounter: Payer: Self-pay | Admitting: Family Medicine

## 2023-04-19 ENCOUNTER — Ambulatory Visit: Payer: BC Managed Care – PPO | Admitting: Family Medicine

## 2023-04-19 ENCOUNTER — Ambulatory Visit (INDEPENDENT_AMBULATORY_CARE_PROVIDER_SITE_OTHER): Payer: BC Managed Care – PPO | Admitting: Family Medicine

## 2023-04-19 VITALS — BP 126/56 | HR 66 | Ht 73.75 in | Wt 183.2 lb

## 2023-04-19 DIAGNOSIS — J3081 Allergic rhinitis due to animal (cat) (dog) hair and dander: Secondary | ICD-10-CM | POA: Diagnosis not present

## 2023-04-19 DIAGNOSIS — F5105 Insomnia due to other mental disorder: Secondary | ICD-10-CM

## 2023-04-19 DIAGNOSIS — S8980XA Other specified injuries of unspecified lower leg, initial encounter: Secondary | ICD-10-CM | POA: Diagnosis not present

## 2023-04-19 DIAGNOSIS — E162 Hypoglycemia, unspecified: Secondary | ICD-10-CM

## 2023-04-19 DIAGNOSIS — E119 Type 2 diabetes mellitus without complications: Secondary | ICD-10-CM | POA: Diagnosis not present

## 2023-04-19 DIAGNOSIS — J3089 Other allergic rhinitis: Secondary | ICD-10-CM | POA: Diagnosis not present

## 2023-04-19 DIAGNOSIS — F3181 Bipolar II disorder: Secondary | ICD-10-CM

## 2023-04-19 DIAGNOSIS — J301 Allergic rhinitis due to pollen: Secondary | ICD-10-CM | POA: Diagnosis not present

## 2023-04-19 DIAGNOSIS — F99 Mental disorder, not otherwise specified: Secondary | ICD-10-CM

## 2023-04-19 LAB — HM DIABETES EYE EXAM

## 2023-04-19 NOTE — Progress Notes (Signed)
OFFICE VISIT  04/19/2023  CC:  Chief Complaint  Patient presents with   Medication Concern    His therapist rx'd medication but he has been having low sugar readings, 4 hrs sleep per night for the past 6-7 weeks. He also would like provide to recheck his leg and feet for injury    Patient is a 47 y.o. male who presents for evaluation of pain in the legs and feet and concern of abnormal sugar levels. A/P as of last visit on 03/12/2023: "#1 diabetes without complication. Excellent control. POC Hba1c today is 5.4%. Feet exam normal today. Continue metformin XR 500 twice daily.   #2 hypertriglyceridemia, has been doing well on fenofibrate 160 mg a day. Lipid panel and hepatic panel today.   #3 GAD and recurrent major depressive disorder. He is improving a little bit with therapy as well as sertraline and buspirone. He is getting followed by a psychiatrist and a counselor through his employer based medical program. No changes today.   #4 hypertension, well-controlled on HCTZ 12.5 mg a day and losartan 25 mg a day. Electrolytes and creatinine today.   # 5 leg and feet pain.  Suspicious for overuse injury but we did not get to discuss this in depth today.  He will make a return visit to evaluate this."  HPI: Psychiatrist diagnosed # with bipolar 2 disorder. He has started on Lamictal and is now on the 50 mg dose.  He does feel improved--less mood swings, less pressured speech, less racing thoughts. Still having lots of trouble sleeping--feels decreased need for sleep.  He exercises several times a day. He has noticed that sugars have stayed persistently in the 70s to 80s, occasionally into the 60s.  This is fasting and even sometimes 2 hours postprandially. He has continued to take his metformin during this time.  He does feel some hypoglycemia symptoms when it is down into the 60s and 70s--- generally fatigued but no dizziness, diaphoresis, confusion, or shakiness.  He does have  overuse pain in the back of his knees bilaterally and in the feet bilaterally.  This came on after a 27 mile hike a month or 2 ago.  This has gradually improved.   ROS as above, plus--> no fevers, no CP, no SOB, no wheezing, no cough, no dizziness, no HAs, no rashes, no melena/hematochezia.  No polyuria or polydipsia.  No focal weakness, paresthesias, or tremors.  No acute vision or hearing abnormalities.  No dysuria or unusual/new urinary urgency or frequency.   No n/v/d or abd pain.  No palpitations.    Past Medical History:  Diagnosis Date   Alcoholism in recovery [facility])    quit 08/2019 (rehab)   Allergic rhinitis    Anxiety and depression    Hx of-->when in recovery period from alcoholism   Diabetes mellitus without complication (HCC)    Elevated liver enzymes    hx of, when he was drinking ETOH heavily   GERD (gastroesophageal reflux disease)    resolved with Nissen/HH surgery 2022   History of pneumonia    Hyperlipidemia    Hypertension     Past Surgical History:  Procedure Laterality Date   ESOPHAGOGASTRODUODENOSCOPY     2021 [facility]   HIATAL HERNIA REPAIR N/A 04/07/2021   Procedure: LAPAROSCOPIC HIATAL HERNIA REPAIR WITH NISSEN FUNDOPLICATION AND GASTRORRHAPHY;  Surgeon: Gaynelle Adu, MD;  Location: [facility];  Service: General;  Laterality: N/A;   left leg surgery     SINUSOTOMY  TONSILLECTOMY AND ADENOIDECTOMY  1995   UPPER GI ENDOSCOPY  04/07/2021   Procedure: UPPER GI ENDOSCOPY;  Surgeon: Gaynelle Adu, MD;  Location: [facility];  Service: General;;   WRIST FRACTURE SURGERY      Outpatient Medications Prior to Visit  Medication Sig Dispense Refill   busPIRone (BUSPAR) 7.5 MG tablet Take 7.5 mg by mouth daily.     fenofibrate 160 MG tablet Take 1 tablet (160 mg total) by mouth daily. 90 tablet 3   fluticasone (FLONASE) 50 MCG/ACT nasal spray SPRAY 2 SPRAYS INTO EACH NOSTRIL EVERY DAY 16 mL 11   hydrochlorothiazide (MICROZIDE) 12.5 MG capsule Take 1  capsule (12.5 mg total) by mouth daily. 90 capsule 3   lamoTRIgine (LAMICTAL) 25 MG tablet Take 25 mg by mouth daily.     levocetirizine (XYZAL) 5 MG tablet TAKE 1 TABLET BY MOUTH EVERY DAY IN THE EVENING 90 tablet 1   losartan (COZAAR) 25 MG tablet TAKE 1 TABLET (25 MG TOTAL) BY MOUTH DAILY. 90 tablet 0   metFORMIN (GLUCOPHAGE-XR) 500 MG 24 hr tablet TAKE 1 TABLET BY MOUTH TWICE A DAY 180 tablet 1   oxybutynin (DITROPAN-XL) 10 MG 24 hr tablet TAKE 1 TABLET BY MOUTH EVERYDAY AT BEDTIME 30 tablet 1   sertraline (ZOLOFT) 50 MG tablet Take 50 mg by mouth daily.     Azelastine-Fluticasone 137-50 MCG/ACT SUSP Place 1-2 sprays into the nose 2 (two) times daily. (Patient not taking: Reported on 03/12/2023)     naproxen (NAPROSYN) 500 MG tablet Take 1 tablet (500 mg total) by mouth 2 (two) times daily with a meal. (Patient not taking: Reported on 04/19/2023) 30 tablet 0   No facility-administered medications prior to visit.    Allergies  Allergen Reactions   Benzonatate Other (See Comments)    Restless legs    Promethazine Other (See Comments)    Restless legs Restless legs    Review of Systems  As per HPI  PE:    04/19/2023   10:54 AM 03/12/2023    8:12 AM 11/09/2022   12:59 PM  Vitals with BMI  Height 6' 1.75"  6' 1.75"  Weight 183 lbs 3 oz 181 lbs 10 oz 186 lbs  BMI 23.69  24.05  Systolic 126 128 629  Diastolic 56 75 74  Pulse 66 71 55     Physical Exam  Gen: Alert, well appearing.  Patient is oriented to person, place, time, and situation. AFFECT: pleasant, lucid thought and speech. Knees: Range of motion intact, no tenderness, no excessive laxity.  No tenderness to palpation in the gastroc tendons or hamstring tendons.  No popliteal mass.  Remedy swelling.  LABS:  Last CBC Lab Results  Component Value Date   WBC 6.0 08/09/2022   HGB 13.6 08/09/2022   HCT 41.0 08/09/2022   MCV 90.2 08/09/2022   MCH 29.1 04/08/2021   RDW 13.6 08/09/2022   PLT 213.0 08/09/2022   Last  metabolic panel Lab Results  Component Value Date   GLUCOSE 101 (H) 03/12/2023   NA 138 03/12/2023   K 4.2 03/12/2023   CL 102 03/12/2023   CO2 28 03/12/2023   BUN 18 03/12/2023   CREATININE 1.04 03/12/2023   GFR 85.35 03/12/2023   CALCIUM 9.9 03/12/2023   PROT 7.4 03/12/2023   ALBUMIN 4.8 03/12/2023   BILITOT 0.6 03/12/2023   ALKPHOS 28 (L) 03/12/2023   AST 22 03/12/2023   ALT 22 03/12/2023   ANIONGAP 7 04/08/2021   Last lipids  Lab Results  Component Value Date   CHOL 147 03/12/2023   HDL 83.50 03/12/2023   LDLCALC 51 03/12/2023   LDLDIRECT 83.0 05/12/2020   TRIG 59.0 03/12/2023   CHOLHDL 2 03/12/2023   Last hemoglobin A1c Lab Results  Component Value Date   HGBA1C 5.4 03/12/2023   HGBA1C 5.4 03/12/2023   HGBA1C 5.4 (A) 03/12/2023   HGBA1C 5.4 03/12/2023   Last thyroid functions Lab Results  Component Value Date   TSH 1.23 08/09/2022   IMPRESSION AND PLAN:  #1 Type 2 diabetes, historically well-controlled.  Now having some hypoglycemia, unclear exactly why.  He exercises very regularly and perhaps this is contributing lately.  At any rate, we will discontinue his metformin at this time.  If glucoses rise consistently greater than 120 fasting that he will restart metformin XR 500 mg tab once a day.  2.  Overuse pain in the lower legs. Gradually improving over time.  Encouraged him to decrease leg exercises in the daytime to allow this to get better quicker.  #3 bipolar disorder. This is improving on Lamictal.  He has managed by psychiatrist.  He is also on Zoloft 50 mg a day and BuSpar. The worst symptom currently is his decreased need for sleep.  I reassured him that this should improve over time on the Lamictal.  An After Visit Summary was printed and given to the patient.  FOLLOW UP: Return in about 4 weeks (around 05/17/2023) for Follow-up hypoglycemia, diabetes.  Signed:  Santiago Bumpers, MD           04/19/2023

## 2023-04-20 ENCOUNTER — Ambulatory Visit: Payer: BC Managed Care – PPO | Admitting: Family Medicine

## 2023-04-25 ENCOUNTER — Encounter (INDEPENDENT_AMBULATORY_CARE_PROVIDER_SITE_OTHER): Payer: Self-pay

## 2023-05-01 ENCOUNTER — Other Ambulatory Visit: Payer: Self-pay | Admitting: Family Medicine

## 2023-05-04 DIAGNOSIS — E162 Hypoglycemia, unspecified: Secondary | ICD-10-CM | POA: Diagnosis not present

## 2023-05-04 DIAGNOSIS — E119 Type 2 diabetes mellitus without complications: Secondary | ICD-10-CM | POA: Diagnosis not present

## 2023-05-10 DIAGNOSIS — J301 Allergic rhinitis due to pollen: Secondary | ICD-10-CM | POA: Diagnosis not present

## 2023-05-10 DIAGNOSIS — J3081 Allergic rhinitis due to animal (cat) (dog) hair and dander: Secondary | ICD-10-CM | POA: Diagnosis not present

## 2023-05-10 DIAGNOSIS — J3089 Other allergic rhinitis: Secondary | ICD-10-CM | POA: Diagnosis not present

## 2023-05-17 DIAGNOSIS — J301 Allergic rhinitis due to pollen: Secondary | ICD-10-CM | POA: Diagnosis not present

## 2023-05-17 DIAGNOSIS — J3089 Other allergic rhinitis: Secondary | ICD-10-CM | POA: Diagnosis not present

## 2023-05-17 DIAGNOSIS — J3081 Allergic rhinitis due to animal (cat) (dog) hair and dander: Secondary | ICD-10-CM | POA: Diagnosis not present

## 2023-05-21 ENCOUNTER — Ambulatory Visit: Payer: BC Managed Care – PPO | Admitting: Family Medicine

## 2023-05-21 ENCOUNTER — Encounter: Payer: Self-pay | Admitting: Family Medicine

## 2023-05-21 VITALS — BP 132/85 | HR 72 | Temp 97.6°F | Ht 73.75 in | Wt 189.2 lb

## 2023-05-21 DIAGNOSIS — E119 Type 2 diabetes mellitus without complications: Secondary | ICD-10-CM | POA: Diagnosis not present

## 2023-05-21 DIAGNOSIS — E162 Hypoglycemia, unspecified: Secondary | ICD-10-CM

## 2023-05-21 DIAGNOSIS — Z7984 Long term (current) use of oral hypoglycemic drugs: Secondary | ICD-10-CM | POA: Diagnosis not present

## 2023-05-21 LAB — GLUCOSE, POCT (MANUAL RESULT ENTRY): POC Glucose: 86 mg/dl (ref 70–99)

## 2023-05-21 NOTE — Progress Notes (Signed)
OFFICE VISIT  05/21/2023  CC: No chief complaint on file.   Patient is a 47 y.o. male who presents for 1 month follow-up diabetes and question of hypoglycemia. A/P as of last visit: "#1 Type 2 diabetes, historically well-controlled.  Now having some hypoglycemia, unclear exactly why.  He exercises very regularly and perhaps this is contributing lately.  At any rate, we will discontinue his metformin at this time.  If glucoses rise consistently greater than 120 fasting that he will restart metformin XR 500 mg tab once a day.   2.  Overuse pain in the lower legs. Gradually improving over time.  Encouraged him to decrease leg exercises in the daytime to allow this to get better quicker.   #3 bipolar disorder. This is improving on Lamictal.  He has managed by psychiatrist.  He is also on Zoloft 50 mg a day and BuSpar. The worst symptom currently is his decreased need for sleep.  I reassured him that this should improve over time on the Lamictal."  INTERIM HX: He has a continuous glucose monitor and now tracks his glucose a lot.  This gives him a lot of information but it is a bit perplexing at times.  His sugar seems to have wide variations in his alarm get set off a lot with low sugar (60).  However, is not clear that he has any symptoms to correlate with the sugars.  The glucoses do not correlate well with eating or with exercise. He does feel excessively hungry all the time and has gained some weight.  However, he has been started on Lamictal in the last couple of months and the dose has been titrated up.  He denies any palpitations, diaphoresis, dizziness, or confusion.   Past Medical History:  Diagnosis Date   Alcoholism in recovery [facility])    quit 08/2019 (rehab)   Allergic rhinitis    Bipolar II disorder (HCC)    Diabetes mellitus without complication (HCC)    Elevated liver enzymes    hx of, when he was drinking ETOH heavily   GERD (gastroesophageal reflux disease)    resolved  with Nissen/HH surgery 2022   History of pneumonia    Hyperlipidemia    Hypertension     Past Surgical History:  Procedure Laterality Date   ESOPHAGOGASTRODUODENOSCOPY     2021 [facility]   HIATAL HERNIA REPAIR N/A 04/07/2021   Procedure: LAPAROSCOPIC HIATAL HERNIA REPAIR WITH NISSEN FUNDOPLICATION AND GASTRORRHAPHY;  Surgeon: Gaynelle Adu, MD;  Location: [facility];  Service: General;  Laterality: N/A;   left leg surgery     SINUSOTOMY     TONSILLECTOMY AND ADENOIDECTOMY  1995   UPPER GI ENDOSCOPY  04/07/2021   Procedure: UPPER GI ENDOSCOPY;  Surgeon: Gaynelle Adu, MD;  Location: [facility];  Service: General;;   WRIST FRACTURE SURGERY      Outpatient Medications Prior to Visit  Medication Sig Dispense Refill   busPIRone (BUSPAR) 7.5 MG tablet Take 7.5 mg by mouth daily.     fenofibrate 160 MG tablet Take 1 tablet (160 mg total) by mouth daily. 90 tablet 3   fluticasone (FLONASE) 50 MCG/ACT nasal spray SPRAY 2 SPRAYS INTO EACH NOSTRIL EVERY DAY 16 mL 11   hydrochlorothiazide (MICROZIDE) 12.5 MG capsule Take 1 capsule (12.5 mg total) by mouth daily. 90 capsule 3   lamoTRIgine (LAMICTAL) 25 MG tablet Take 25 mg by mouth daily.     levocetirizine (XYZAL) 5 MG tablet TAKE 1 TABLET BY MOUTH  EVERY DAY IN THE EVENING 90 tablet 1   losartan (COZAAR) 25 MG tablet TAKE 1 TABLET (25 MG TOTAL) BY MOUTH DAILY. 90 tablet 0   metFORMIN (GLUCOPHAGE-XR) 500 MG 24 hr tablet TAKE 1 TABLET BY MOUTH TWICE A DAY 180 tablet 1   oxybutynin (DITROPAN-XL) 10 MG 24 hr tablet TAKE 1 TABLET BY MOUTH EVERYDAY AT BEDTIME 30 tablet 1   sertraline (ZOLOFT) 50 MG tablet Take 50 mg by mouth daily.     No facility-administered medications prior to visit.    Allergies  Allergen Reactions   Benzonatate Other (See Comments)    Restless legs    Promethazine Other (See Comments)    Restless legs Restless legs    Review of Systems As per HPI  PE:    04/19/2023   10:54 AM 03/12/2023    8:12 AM  11/09/2022   12:59 PM  Vitals with BMI  Height 6' 1.75"  6' 1.75"  Weight 183 lbs 3 oz 181 lbs 10 oz 186 lbs  BMI 23.69  24.05  Systolic 126 128 865  Diastolic 56 75 74  Pulse 66 71 55     Physical Exam  Gen: Alert, well appearing.  Patient is oriented to person, place, time, and situation. AFFECT: pleasant, lucid thought and speech. No further exam today  LABS:  Last metabolic panel Lab Results  Component Value Date   GLUCOSE 101 (H) 03/12/2023   NA 138 03/12/2023   K 4.2 03/12/2023   CL 102 03/12/2023   CO2 28 03/12/2023   BUN 18 03/12/2023   CREATININE 1.04 03/12/2023   GFR 85.35 03/12/2023   CALCIUM 9.9 03/12/2023   PROT 7.4 03/12/2023   ALBUMIN 4.8 03/12/2023   BILITOT 0.6 03/12/2023   ALKPHOS 28 (L) 03/12/2023   AST 22 03/12/2023   ALT 22 03/12/2023   ANIONGAP 7 04/08/2021   Last hemoglobin A1c Lab Results  Component Value Date   HGBA1C 5.4 03/12/2023   HGBA1C 5.4 03/12/2023   HGBA1C 5.4 (A) 03/12/2023   HGBA1C 5.4 03/12/2023   His capillary glucose today in the office was 86.  His continuous glucose monitor read 84 at that time.  IMPRESSION AND PLAN:  #1 erratic glucoses. Clinical correlation very unclear. Not clearly changed since complete Lee stopping metformin 1 month ago. No changes today.  Would like to get a little more data: Check C-peptide and insulin level as well as GAD antibody.  An After Visit Summary was printed and given to the patient.  FOLLOW UP: No follow-ups on file.  Signed:  Santiago Bumpers, MD           05/21/2023

## 2023-05-22 LAB — BASIC METABOLIC PANEL
BUN: 24 mg/dL — ABNORMAL HIGH (ref 6–23)
CO2: 28 mEq/L (ref 19–32)
Calcium: 9.5 mg/dL (ref 8.4–10.5)
Chloride: 101 meq/L (ref 96–112)
Creatinine, Ser: 0.88 mg/dL (ref 0.40–1.50)
GFR: 102.08 mL/min (ref >60.00)
Glucose, Bld: 91 mg/dL (ref 70–99)
Potassium: 4 meq/L (ref 3.5–5.1)
Sodium: 138 mEq/L (ref 135–145)

## 2023-05-25 LAB — INSULIN AND C-PEPTIDE, SERUM
C-Peptide: 1.7 ng/mL (ref 1.1–4.4)
INSULIN: 3.4 u[IU]/mL (ref 2.6–24.9)

## 2023-05-25 LAB — GLUTAMIC ACID DECARBOXYLASE AUTO ABS: Glutamic Acid Decarb Ab: <5 U/mL (ref 0.0–5.0)

## 2023-05-31 DIAGNOSIS — J301 Allergic rhinitis due to pollen: Secondary | ICD-10-CM | POA: Diagnosis not present

## 2023-05-31 DIAGNOSIS — J3089 Other allergic rhinitis: Secondary | ICD-10-CM | POA: Diagnosis not present

## 2023-05-31 DIAGNOSIS — J3081 Allergic rhinitis due to animal (cat) (dog) hair and dander: Secondary | ICD-10-CM | POA: Diagnosis not present

## 2023-06-06 ENCOUNTER — Encounter: Payer: Self-pay | Admitting: Family Medicine

## 2023-06-06 NOTE — Telephone Encounter (Signed)
Waiting for PCP response

## 2023-06-13 MED ORDER — METFORMIN HCL ER 500 MG PO TB24: ORAL_TABLET | ORAL | 1 refills | Status: DC

## 2023-06-13 NOTE — Telephone Encounter (Signed)
Restart metformin XR 500mg  twice a day. I sent in new rx today

## 2023-06-14 DIAGNOSIS — J3081 Allergic rhinitis due to animal (cat) (dog) hair and dander: Secondary | ICD-10-CM | POA: Diagnosis not present

## 2023-06-14 DIAGNOSIS — J301 Allergic rhinitis due to pollen: Secondary | ICD-10-CM | POA: Diagnosis not present

## 2023-06-14 DIAGNOSIS — J3089 Other allergic rhinitis: Secondary | ICD-10-CM | POA: Diagnosis not present

## 2023-06-14 NOTE — Telephone Encounter (Signed)
No further action needed.

## 2023-06-15 DIAGNOSIS — E119 Type 2 diabetes mellitus without complications: Secondary | ICD-10-CM | POA: Diagnosis not present

## 2023-06-15 DIAGNOSIS — E162 Hypoglycemia, unspecified: Secondary | ICD-10-CM | POA: Diagnosis not present

## 2023-06-28 DIAGNOSIS — J301 Allergic rhinitis due to pollen: Secondary | ICD-10-CM | POA: Diagnosis not present

## 2023-06-28 DIAGNOSIS — J3081 Allergic rhinitis due to animal (cat) (dog) hair and dander: Secondary | ICD-10-CM | POA: Diagnosis not present

## 2023-06-28 DIAGNOSIS — J3089 Other allergic rhinitis: Secondary | ICD-10-CM | POA: Diagnosis not present

## 2023-07-12 ENCOUNTER — Other Ambulatory Visit: Payer: Self-pay | Admitting: Family Medicine

## 2023-07-12 DIAGNOSIS — J3081 Allergic rhinitis due to animal (cat) (dog) hair and dander: Secondary | ICD-10-CM | POA: Diagnosis not present

## 2023-07-12 DIAGNOSIS — J3089 Other allergic rhinitis: Secondary | ICD-10-CM | POA: Diagnosis not present

## 2023-07-12 DIAGNOSIS — J301 Allergic rhinitis due to pollen: Secondary | ICD-10-CM | POA: Diagnosis not present

## 2023-08-16 DIAGNOSIS — E119 Type 2 diabetes mellitus without complications: Secondary | ICD-10-CM | POA: Diagnosis not present

## 2023-08-16 DIAGNOSIS — E162 Hypoglycemia, unspecified: Secondary | ICD-10-CM | POA: Diagnosis not present

## 2023-08-29 ENCOUNTER — Other Ambulatory Visit: Payer: Self-pay | Admitting: Family Medicine

## 2023-09-06 DIAGNOSIS — J3089 Other allergic rhinitis: Secondary | ICD-10-CM | POA: Diagnosis not present

## 2023-09-06 DIAGNOSIS — J301 Allergic rhinitis due to pollen: Secondary | ICD-10-CM | POA: Diagnosis not present

## 2023-09-06 DIAGNOSIS — J3081 Allergic rhinitis due to animal (cat) (dog) hair and dander: Secondary | ICD-10-CM | POA: Diagnosis not present

## 2023-09-13 DIAGNOSIS — J3081 Allergic rhinitis due to animal (cat) (dog) hair and dander: Secondary | ICD-10-CM | POA: Diagnosis not present

## 2023-09-13 DIAGNOSIS — J301 Allergic rhinitis due to pollen: Secondary | ICD-10-CM | POA: Diagnosis not present

## 2023-09-13 DIAGNOSIS — J3089 Other allergic rhinitis: Secondary | ICD-10-CM | POA: Diagnosis not present

## 2023-10-02 DIAGNOSIS — J3081 Allergic rhinitis due to animal (cat) (dog) hair and dander: Secondary | ICD-10-CM | POA: Diagnosis not present

## 2023-10-02 DIAGNOSIS — J301 Allergic rhinitis due to pollen: Secondary | ICD-10-CM | POA: Diagnosis not present

## 2023-10-02 DIAGNOSIS — J3089 Other allergic rhinitis: Secondary | ICD-10-CM | POA: Diagnosis not present

## 2023-10-03 DIAGNOSIS — E119 Type 2 diabetes mellitus without complications: Secondary | ICD-10-CM | POA: Diagnosis not present

## 2023-10-03 DIAGNOSIS — E162 Hypoglycemia, unspecified: Secondary | ICD-10-CM | POA: Diagnosis not present

## 2023-10-04 DIAGNOSIS — J301 Allergic rhinitis due to pollen: Secondary | ICD-10-CM | POA: Diagnosis not present

## 2023-10-04 DIAGNOSIS — J3081 Allergic rhinitis due to animal (cat) (dog) hair and dander: Secondary | ICD-10-CM | POA: Diagnosis not present

## 2023-10-04 DIAGNOSIS — J3089 Other allergic rhinitis: Secondary | ICD-10-CM | POA: Diagnosis not present

## 2023-10-06 ENCOUNTER — Other Ambulatory Visit: Payer: Self-pay | Admitting: Family Medicine

## 2023-10-09 ENCOUNTER — Other Ambulatory Visit: Payer: Self-pay | Admitting: Family Medicine

## 2023-11-06 DIAGNOSIS — E119 Type 2 diabetes mellitus without complications: Secondary | ICD-10-CM | POA: Diagnosis not present

## 2023-11-06 DIAGNOSIS — E162 Hypoglycemia, unspecified: Secondary | ICD-10-CM | POA: Diagnosis not present

## 2023-11-07 ENCOUNTER — Other Ambulatory Visit: Payer: Self-pay | Admitting: Family Medicine

## 2023-11-09 DIAGNOSIS — F4323 Adjustment disorder with mixed anxiety and depressed mood: Secondary | ICD-10-CM | POA: Diagnosis not present

## 2023-11-16 ENCOUNTER — Other Ambulatory Visit: Payer: Self-pay | Admitting: Family Medicine

## 2023-11-20 DIAGNOSIS — F4323 Adjustment disorder with mixed anxiety and depressed mood: Secondary | ICD-10-CM | POA: Diagnosis not present

## 2023-12-01 DIAGNOSIS — F4323 Adjustment disorder with mixed anxiety and depressed mood: Secondary | ICD-10-CM | POA: Diagnosis not present

## 2023-12-03 ENCOUNTER — Other Ambulatory Visit: Payer: Self-pay | Admitting: Family Medicine

## 2023-12-04 ENCOUNTER — Encounter: Payer: Self-pay | Admitting: Family Medicine

## 2023-12-04 ENCOUNTER — Other Ambulatory Visit: Payer: Self-pay

## 2023-12-04 MED ORDER — OXYBUTYNIN CHLORIDE ER 10 MG PO TB24: 10.0000 mg | ORAL_TABLET | Freq: Every day | ORAL | 0 refills | Status: DC

## 2023-12-07 ENCOUNTER — Other Ambulatory Visit: Payer: Self-pay | Admitting: Family Medicine

## 2023-12-11 DIAGNOSIS — E162 Hypoglycemia, unspecified: Secondary | ICD-10-CM | POA: Diagnosis not present

## 2023-12-11 DIAGNOSIS — E119 Type 2 diabetes mellitus without complications: Secondary | ICD-10-CM | POA: Diagnosis not present

## 2023-12-24 DIAGNOSIS — J3089 Other allergic rhinitis: Secondary | ICD-10-CM | POA: Diagnosis not present

## 2023-12-24 DIAGNOSIS — J301 Allergic rhinitis due to pollen: Secondary | ICD-10-CM | POA: Diagnosis not present

## 2023-12-24 DIAGNOSIS — J3081 Allergic rhinitis due to animal (cat) (dog) hair and dander: Secondary | ICD-10-CM | POA: Diagnosis not present

## 2023-12-25 DIAGNOSIS — F4323 Adjustment disorder with mixed anxiety and depressed mood: Secondary | ICD-10-CM | POA: Diagnosis not present

## 2023-12-26 ENCOUNTER — Other Ambulatory Visit: Payer: Self-pay | Admitting: Family Medicine

## 2023-12-31 ENCOUNTER — Ambulatory Visit (INDEPENDENT_AMBULATORY_CARE_PROVIDER_SITE_OTHER): Admitting: Family Medicine

## 2023-12-31 VITALS — BP 137/78 | HR 62 | Ht 73.75 in | Wt 182.6 lb

## 2023-12-31 DIAGNOSIS — I10 Essential (primary) hypertension: Secondary | ICD-10-CM | POA: Diagnosis not present

## 2023-12-31 DIAGNOSIS — J301 Allergic rhinitis due to pollen: Secondary | ICD-10-CM | POA: Diagnosis not present

## 2023-12-31 DIAGNOSIS — E119 Type 2 diabetes mellitus without complications: Secondary | ICD-10-CM | POA: Diagnosis not present

## 2023-12-31 DIAGNOSIS — Z0000 Encounter for general adult medical examination without abnormal findings: Secondary | ICD-10-CM | POA: Diagnosis not present

## 2023-12-31 DIAGNOSIS — J3089 Other allergic rhinitis: Secondary | ICD-10-CM | POA: Diagnosis not present

## 2023-12-31 DIAGNOSIS — E7800 Pure hypercholesterolemia, unspecified: Secondary | ICD-10-CM

## 2023-12-31 DIAGNOSIS — Z7984 Long term (current) use of oral hypoglycemic drugs: Secondary | ICD-10-CM

## 2023-12-31 LAB — POCT GLYCOSYLATED HEMOGLOBIN (HGB A1C)
HbA1c POC (<> result, manual entry): 5.6 % (ref 4.0–5.6)
HbA1c, POC (controlled diabetic range): 5.6 % (ref 0.0–7.0)
HbA1c, POC (prediabetic range): 5.6 % — AB (ref 5.7–6.4)
Hemoglobin A1C: 5.6 % (ref 4.0–5.6)

## 2023-12-31 MED ORDER — LOSARTAN POTASSIUM 25 MG PO TABS: 25.0000 mg | ORAL_TABLET | Freq: Every day | ORAL | 3 refills | Status: AC

## 2023-12-31 MED ORDER — OXYBUTYNIN CHLORIDE ER 10 MG PO TB24: 10.0000 mg | ORAL_TABLET | Freq: Every day | ORAL | 3 refills | Status: AC

## 2023-12-31 MED ORDER — HYDROCHLOROTHIAZIDE 12.5 MG PO CAPS: 12.5000 mg | ORAL_CAPSULE | Freq: Every day | ORAL | 3 refills | Status: AC

## 2023-12-31 MED ORDER — ALBUTEROL SULFATE HFA 108 (90 BASE) MCG/ACT IN AERS: 2.0000 | INHALATION_SPRAY | Freq: Four times a day (QID) | RESPIRATORY_TRACT | 0 refills | Status: DC | PRN

## 2023-12-31 MED ORDER — METHYLPREDNISOLONE ACETATE 80 MG/ML IJ SUSP
80.0000 mg | Freq: Once | INTRAMUSCULAR | Status: AC
  Administered 2023-12-31: 80 mg via INTRAMUSCULAR

## 2023-12-31 MED ORDER — FENOFIBRATE 160 MG PO TABS: 160.0000 mg | ORAL_TABLET | Freq: Every day | ORAL | 3 refills | Status: AC

## 2023-12-31 NOTE — Progress Notes (Signed)
 Office Note 12/31/2023  CC:  Chief Complaint  Patient presents with   Medical Management of Chronic Issues   Patient is a 48 y.o. male who is here for annual health maintenance exam and follow-up diabetes, hypertension, hyperlipidemia.  He has had 1 week of worsening nasal congestion, postnasal drip, sore throat, sneezing, and watery itchy eyes.  No headache, facial pain, or wheezing.  He does feel like it is difficult to get a full deep breath lately. He has chronic allergic rhinitis and spring is his worst season. He gets allergy injections, takes daily Xyzal, and has recently restarted Flonase.  He gets good effect from Ditropan XL 10 mg nightly for his overactive bladder symptoms.  Home blood pressures consistently less than 130/70.  Diet is good.  He works out 3 times a day. His weight tends to fluctuate pretty easily.  He does still see a psychiatrist.  He is off of sertraline.  He continues to take Lamictal 100 mg a day and BuSpar 7.5 mg a day.  ROS as above, plus--> no fevers, no CP, no dizziness, no HAs, no rashes, no melena/hematochezia.  No polyuria or polydipsia.  No myalgias or arthralgias.  No focal weakness, paresthesias, or tremors.  No acute vision or hearing abnormalities.  No dysuria or hematuria.  No recent changes in lower legs. No n/v/d or abd pain.  No palpitations.     Past Medical History:  Diagnosis Date   Alcoholism in recovery [facility])    quit 08/2019 (rehab)   Allergic rhinitis    Bipolar II disorder (HCC)    Diabetes mellitus without complication (HCC)    Elevated liver enzymes    hx of, when he was drinking ETOH heavily   GERD (gastroesophageal reflux disease)    resolved with Nissen/HH surgery 2022   History of pneumonia    Hyperlipidemia    Hypertension     Past Surgical History:  Procedure Laterality Date   ESOPHAGOGASTRODUODENOSCOPY     2021 [facility]   HIATAL HERNIA REPAIR N/A 04/07/2021   Procedure: LAPAROSCOPIC  HIATAL HERNIA REPAIR WITH NISSEN FUNDOPLICATION AND GASTRORRHAPHY;  Surgeon: Gaynelle Adu, MD;  Location[facility];  Service: General;  Laterality: N/A;   left leg surgery     SINUSOTOMY     TONSILLECTOMY AND ADENOIDECTOMY  1995   UPPER GI ENDOSCOPY  04/07/2021   Procedure: UPPER GI ENDOSCOPY;  Surgeon: Gaynelle Adu, MD;  [facility];  Service: General;;   WRIST FRACTURE SURGERY      Family History  Problem Relation Age of Onset   Healthy Mother    Hypertension Father    Hyperlipidemia Father    Prostate cancer Father    Bladder Cancer Father    Birth defects Maternal Grandfather    Bone cancer Maternal Grandfather    Birth defects Paternal Grandfather    Cancer Maternal Uncle     Social History   Socioeconomic History   Marital status: Married    Spouse name: Not on file   Number of children: 2   Years of education: Not on file   Highest education level: Bachelor's degree (e.g., BA, AB, BS)  Occupational History   Not on file  Tobacco Use   Smoking status: Former   Smokeless tobacco: Former    Quit date: 10/29/2017  Vaping Use   Vaping status: Former   Quit date: 11/23/2020  Substance and Sexual Activity   Alcohol use: Not Currently    Alcohol/week: 4.0 standard  drinks of alcohol    Types: 1 Glasses of wine, 1 Cans of beer, 1 Shots of liquor, 1 Standard drinks or equivalent per week    Comment: no alcohol in 8-22months   Drug use: Not Currently    Types: Marijuana   Sexual activity: Yes  Other Topics Concern   Not on file  Social History Narrative   Married, 1 son and 1 daughter.   Orig from [place], [place].   Educ: college grad   Occup: Engineering geologist.   Tob: none   Alc: Alcoholism, sober 2020-->went to [place]hab in [place]   Drugs: none   Social Drivers of Corporate investment banker Strain: Low Risk  (12/31/2023)   Overall Financial Resource Strain (CARDIA)    Difficulty of Paying Living Expenses: Not hard at all  Food Insecurity: No  Food Insecurity (12/31/2023)   Hunger Vital Sign    Worried About Running Out of Food in the Last Year: Never true    Ran Out of Food in the Last Year: Never true  Transportation Needs: No Transportation Needs (12/31/2023)   PRAPARE - Administrator, Civil Service (Medical): No    Lack of Transportation (Non-Medical): No  Physical Activity: Sufficiently Active (12/31/2023)   Exercise Vital Sign    Days of Exercise per Week: 7 days    Minutes of Exercise per Session: 150+ min  Stress: Stress Concern Present (12/31/2023)   [employer] of Occupational Health - Occupational Stress Questionnaire    Feeling of Stress : Very much  Social Connections: Socially Integrated (12/31/2023)   Social Connection and Isolation Panel [NHANES]    Frequency of Communication with Friends and Family: More than three times a week    Frequency of Social Gatherings with Friends and Family: Twice a week    Attends Religious Services: More than 4 times per year    Active Member of [employer] or Organizations: Yes    Attends Engineer, structural: More than 4 times per year    Marital Status: Married  Catering manager Violence: Not on file    Outpatient Medications Prior to Visit  Medication Sig Dispense Refill   busPIRone (BUSPAR) 7.5 MG tablet Take 7.5 mg by mouth daily.     fluticasone (FLONASE) 50 MCG/ACT nasal spray SPRAY 2 SPRAYS INTO EACH NOSTRIL EVERY DAY 16 mL 11   lamoTRIgine (LAMICTAL) 100 MG tablet Take 100 mg by mouth daily.     levocetirizine (XYZAL) 5 MG tablet TAKE 1 TABLET BY MOUTH EVERY DAY IN THE EVENING 30 tablet 2   metFORMIN (GLUCOPHAGE-XR) 500 MG 24 hr tablet TAKE 1 TABLET BY MOUTH TWICE A DAY 180 tablet 1   fenofibrate 160 MG tablet Take 1 tablet (160 mg total) by mouth daily. 90 tablet 3   hydrochlorothiazide (MICROZIDE) 12.5 MG capsule Take 1 capsule (12.5 mg total) by mouth daily. 90 capsule 3   losartan (COZAAR) 25 MG tablet Take 1 tablet (25 mg total) by mouth daily.  OFFICE VISIT NEEDED FOR FURTHER REFILLS 14 tablet 0   oxybutynin (DITROPAN-XL) 10 MG 24 hr tablet Take 1 tablet (10 mg total) by mouth at bedtime. 30 tablet 0   sertraline (ZOLOFT) 50 MG tablet Take 50 mg by mouth daily.     No facility-administered medications prior to visit.    Allergies  Allergen Reactions   Benzonatate Other (See Comments)    Restless legs    Promethazine Other (See Comments)    Restless  legs Restless legs    Review of Systems  Constitutional:  Negative for appetite change, chills, fatigue and fever.  HENT:  Negative for congestion, dental problem, ear pain and sore throat.   Eyes:  Negative for discharge, redness and visual disturbance.  Respiratory:  Negative for cough, chest tightness, shortness of breath and wheezing.   Cardiovascular:  Negative for chest pain, palpitations and leg swelling.  Gastrointestinal:  Negative for abdominal pain, blood in stool, diarrhea, nausea and vomiting.  Genitourinary:  Negative for difficulty urinating, dysuria, flank pain, frequency, hematuria and urgency.  Musculoskeletal:  Negative for arthralgias, back pain, joint swelling, myalgias and neck stiffness.  Skin:  Negative for pallor and rash.  Neurological:  Negative for dizziness, speech difficulty, weakness and headaches.  Hematological:  Negative for adenopathy. Does not bruise/bleed easily.  Psychiatric/Behavioral:  Negative for confusion and sleep disturbance. The patient is not nervous/anxious.     PE;    12/31/2023    3:06 PM 05/21/2023    2:31 PM 04/19/2023   10:54 AM  Vitals with BMI  Height 6' 1.75" 6' 1.75" 6' 1.75"  Weight 182 lbs 10 oz 189 lbs 3 oz 183 lbs 3 oz  BMI 23.61 24.46 23.69  Systolic 137 132 161  Diastolic 78 85 56  Pulse 62 72 66   Gen: Alert, well appearing.  Patient is oriented to person, place, time, and situation. AFFECT: pleasant, lucid thought and speech. ENT: Ears: EACs clear, normal epithelium.  TMs with good light reflex and  landmarks bilaterally.  Eyes: no injection, icteris, swelling, or exudate.  EOMI, PERRLA. Nose: no drainage or turbinate edema/swelling.  No injection or focal lesion.  Mouth: lips without lesion/swelling.  Oral mucosa pink and moist.  Dentition intact and without obvious caries or gingival swelling.  Oropharynx without erythema, exudate, or swelling.  Neck: supple/nontender.  No LAD, mass, or TM.  Carotid pulses 2+ bilaterally, without bruits. CV: RRR, no m/r/g.   LUNGS: CTA bilat, nonlabored resps, good aeration in all lung fields. ABD: soft, NT, ND, BS normal.  No hepatospenomegaly or mass.  No bruits. EXT: no clubbing, cyanosis, or edema.  Musculoskeletal: no joint swelling, erythema, warmth, or tenderness.  ROM of all joints intact. Skin - no sores or suspicious lesions or rashes or color changes  Pertinent labs:  Lab Results  Component Value Date   TSH 1.23 08/09/2022   Lab Results  Component Value Date   WBC 6.0 08/09/2022   HGB 13.6 08/09/2022   HCT 41.0 08/09/2022   MCV 90.2 08/09/2022   PLT 213.0 08/09/2022   Lab Results  Component Value Date   CREATININE 0.88 05/21/2023   BUN 24 (H) 05/21/2023   NA 138 05/21/2023   K 4.0 05/21/2023   CL 101 05/21/2023   CO2 28 05/21/2023   Lab Results  Component Value Date   ALT 22 03/12/2023   AST 22 03/12/2023   ALKPHOS 28 (L) 03/12/2023   BILITOT 0.6 03/12/2023   Lab Results  Component Value Date   CHOL 147 03/12/2023   Lab Results  Component Value Date   HDL 83.50 03/12/2023   Lab Results  Component Value Date   LDLCALC 51 03/12/2023   Lab Results  Component Value Date   TRIG 59.0 03/12/2023   Lab Results  Component Value Date   CHOLHDL 2 03/12/2023   Lab Results  Component Value Date   HGBA1C 5.6 12/31/2023   HGBA1C 5.6 12/31/2023   HGBA1C 5.6 (A) 12/31/2023  HGBA1C 5.6 12/31/2023   ASSESSMENT AND PLAN:   1) Health maintenance exam: Reviewed age and gender appropriate health maintenance issues  (prudent diet, regular exercise, health risks of tobacco and excessive alcohol, use of seatbelts, fire alarms in home, use of sunscreen).  Also reviewed age and gender appropriate health screening as well as vaccine recommendations. Vaccines: Prevnar 20-->deferred today. otherwise up-to-date Labs: HP, urine microalb/cr, and POC Hba1c. Prostate ca screening: average risk patient= as per latest guidelines, start screening at 90 yrs of age. Colon ca screening: no polyps 09/2020-->recall 2032.  #2 diabetes without complication.  Excellent control. Point-of-care hemoglobin A1c today is 5.6%. Continue metformin XR 500 mg twice daily. Feet exam normal today. Urine microalbumin/creatinine today.  #3 allergic rhinosinusitis.  He has a bit of bronchospasm cough. Depo-Medrol 80 mg IM today. Continue once daily nonsedating antihistamine and once daily nasal steroid. Albuterol inhaler prescribed, 1 to 2 puffs every 6 hours as needed.  #4 hypertension, well-controlled on HCTZ 12.5 mg a day and losartan 25 mg a day. Electrolytes and creatinine monitoring today.  5.  Hypertriglyceridemia. Continue fenofibrate 160 mg a day. Lipid panel today (he last ate approximately 4 hours ago).  An After Visit Summary was printed and given to the patient.  FOLLOW UP:  Return in about 6 months (around 07/01/2024) for routine chronic illness f/u.  Signed:  Santiago Bumpers, MD           12/31/2023

## 2024-01-01 ENCOUNTER — Encounter: Payer: Self-pay | Admitting: Family Medicine

## 2024-01-01 LAB — COMPREHENSIVE METABOLIC PANEL WITH GFR
ALT: 17 U/L (ref 0–53)
AST: 25 U/L (ref 0–37)
Albumin: 5.2 g/dL (ref 3.5–5.2)
Alkaline Phosphatase: 44 U/L (ref 39–117)
BUN: 21 mg/dL (ref 6–23)
CO2: 29 meq/L (ref 19–32)
Calcium: 9.8 mg/dL (ref 8.4–10.5)
Chloride: 99 meq/L (ref 96–112)
Creatinine, Ser: 0.91 mg/dL (ref 0.40–1.50)
GFR: 99.62 mL/min (ref >60.00)
Glucose, Bld: 95 mg/dL (ref 70–99)
Potassium: 3.8 meq/L (ref 3.5–5.1)
Sodium: 140 meq/L (ref 135–145)
Total Bilirubin: 0.4 mg/dL (ref 0.2–1.2)
Total Protein: 7.6 g/dL (ref 6.0–8.3)

## 2024-01-01 LAB — LIPID PANEL
Cholesterol: 151 mg/dL (ref 0–200)
HDL: 91 mg/dL (ref >39.00)
LDL Cholesterol: 50 mg/dL (ref 0–99)
NonHDL: 59.75
Total CHOL/HDL Ratio: 2
Triglycerides: 47 mg/dL (ref 0.0–149.0)
VLDL: 9.4 mg/dL (ref 0.0–40.0)

## 2024-01-01 LAB — CBC WITH DIFFERENTIAL/PLATELET
Basophils Absolute: 0.1 10*3/uL (ref 0.0–0.1)
Basophils Relative: 0.7 % (ref 0.0–3.0)
Eosinophils Absolute: 0.1 10*3/uL (ref 0.0–0.7)
Eosinophils Relative: 0.8 % (ref 0.0–5.0)
HCT: 39.8 % (ref 39.0–52.0)
Hemoglobin: 13.9 g/dL (ref 13.0–17.0)
Lymphocytes Relative: 11.2 % — ABNORMAL LOW (ref 12.0–46.0)
Lymphs Abs: 1.1 10*3/uL (ref 0.7–4.0)
MCHC: 34.9 g/dL (ref 30.0–36.0)
MCV: 90.1 fl (ref 78.0–100.0)
Monocytes Absolute: 0.7 10*3/uL (ref 0.1–1.0)
Monocytes Relative: 7.3 % (ref 3.0–12.0)
Neutro Abs: 7.9 10*3/uL — ABNORMAL HIGH (ref 1.4–7.7)
Neutrophils Relative %: 80 % — ABNORMAL HIGH (ref 43.0–77.0)
Platelets: 230 10*3/uL (ref 150.0–400.0)
RBC: 4.42 Mil/uL (ref 4.22–5.81)
RDW: 13.6 % (ref 11.5–15.5)
WBC: 9.9 10*3/uL (ref 4.0–10.5)

## 2024-01-01 LAB — MICROALBUMIN / CREATININE URINE RATIO
Creatinine,U: 126.4 mg/dL
Microalb Creat Ratio: 14 mg/g (ref 0.0–30.0)
Microalb, Ur: 1.8 mg/dL (ref 0.0–1.9)

## 2024-01-01 LAB — TSH: TSH: 1.27 u[IU]/mL (ref 0.35–5.50)

## 2024-01-07 MED ORDER — AMOXICILLIN 875 MG PO TABS: 875.0000 mg | ORAL_TABLET | Freq: Two times a day (BID) | ORAL | 0 refills | Status: AC

## 2024-01-07 MED ORDER — PREDNISONE 10 MG PO TABS: ORAL_TABLET | ORAL | 0 refills | Status: DC

## 2024-01-07 NOTE — Telephone Encounter (Signed)
 Okay. Amoxicillin and a prednisone taper sent in today.

## 2024-01-07 NOTE — Telephone Encounter (Signed)
 No further action needed.

## 2024-01-12 ENCOUNTER — Other Ambulatory Visit: Payer: Self-pay | Admitting: Family Medicine

## 2024-01-22 DIAGNOSIS — F4323 Adjustment disorder with mixed anxiety and depressed mood: Secondary | ICD-10-CM | POA: Diagnosis not present

## 2024-02-07 ENCOUNTER — Ambulatory Visit (INDEPENDENT_AMBULATORY_CARE_PROVIDER_SITE_OTHER): Payer: Self-pay | Admitting: Podiatry

## 2024-02-07 ENCOUNTER — Encounter: Payer: Self-pay | Admitting: Podiatry

## 2024-02-07 ENCOUNTER — Ambulatory Visit (INDEPENDENT_AMBULATORY_CARE_PROVIDER_SITE_OTHER)

## 2024-02-07 DIAGNOSIS — M2042 Other hammer toe(s) (acquired), left foot: Secondary | ICD-10-CM | POA: Diagnosis not present

## 2024-02-07 DIAGNOSIS — M7741 Metatarsalgia, right foot: Secondary | ICD-10-CM

## 2024-02-07 DIAGNOSIS — M7742 Metatarsalgia, left foot: Secondary | ICD-10-CM

## 2024-02-07 DIAGNOSIS — M2041 Other hammer toe(s) (acquired), right foot: Secondary | ICD-10-CM

## 2024-02-07 NOTE — Progress Notes (Signed)
 Subjective:  Patient ID: Kyle [name], male    DOB: October 20, 1975,  MRN: [id number] HPI Chief Complaint  Patient presents with   Toe Pain    RM#9 Bilateral toe pain not foot pain all in the toes ongoing for about 1 year.    48 y.o. male presents with the above complaint.   ROS: Denies fever chills nausea mobic muscle aches pains calf pain back pain chest pain shortness of breath.  Past Medical History:  Diagnosis Date   Alcoholism in recovery [facility])    quit 08/2019 (rehab)   Allergic rhinitis    Bipolar II disorder (HCC)    Diabetes mellitus without complication (HCC)    Elevated liver enzymes    hx of, when he was drinking ETOH heavily   GERD (gastroesophageal reflux disease)    resolved with Nissen/HH surgery 2022   History of pneumonia    Hyperlipidemia    Hypertension    Past Surgical History:  Procedure Laterality Date   ESOPHAGOGASTRODUODENOSCOPY     2021 [facility]   HIATAL HERNIA REPAIR N/A 04/07/2021   Procedure: LAPAROSCOPIC HIATAL HERNIA REPAIR WITH NISSEN FUNDOPLICATION AND GASTRORRHAPHY;  Surgeon: Aldean Hummingbird, MD;  Location[facility];  Service: General;  Laterality: N/A;   left leg surgery     SINUSOTOMY     TONSILLECTOMY AND ADENOIDECTOMY  1995   UPPER GI ENDOSCOPY  04/07/2021   Procedure: UPPER GI ENDOSCOPY;  Surgeon: Aldean Hummingbird, MD;  Location: [facility];  Service: General;;   WRIST FRACTURE SURGERY      Current Outpatient Medications:    albuterol  (VENTOLIN  HFA) 108 (90 Base) MCG/ACT inhaler, Inhale 2 puffs into the lungs every 6 (six) hours as needed for wheezing or shortness of breath., Disp: 8 g, Rfl: 0   busPIRone (BUSPAR) 7.5 MG tablet, Take 7.5 mg by mouth daily., Disp: , Rfl:    fenofibrate  160 MG tablet, Take 1 tablet (160 mg total) by mouth daily., Disp: 90 tablet, Rfl: 3   fluticasone  (FLONASE ) 50 MCG/ACT nasal spray, SPRAY 2 SPRAYS INTO EACH NOSTRIL EVERY DAY, Disp: 16 mL, Rfl: 11   hydrochlorothiazide  (MICROZIDE ) 12.5 MG  capsule, Take 1 capsule (12.5 mg total) by mouth daily., Disp: 90 capsule, Rfl: 3   lamoTRIgine (LAMICTAL) 100 MG tablet, Take 100 mg by mouth daily., Disp: , Rfl:    levocetirizine (XYZAL ) 5 MG tablet, TAKE 1 TABLET BY MOUTH EVERY DAY IN THE EVENING, Disp: 30 tablet, Rfl: 2   losartan  (COZAAR ) 25 MG tablet, Take 1 tablet (25 mg total) by mouth daily., Disp: 90 tablet, Rfl: 3   metFORMIN  (GLUCOPHAGE -XR) 500 MG 24 hr tablet, TAKE 1 TABLET BY MOUTH TWICE A DAY, Disp: 180 tablet, Rfl: 1   oxybutynin  (DITROPAN -XL) 10 MG 24 hr tablet, Take 1 tablet (10 mg total) by mouth at bedtime., Disp: 90 tablet, Rfl: 3   predniSONE  (DELTASONE ) 10 MG tablet, 3 tabs daily x 3 days, then 2 tabs daily x 3 days, then 1 tab daily x 3 days, Disp: 18 tablet, Rfl: 0  Allergies  Allergen Reactions   Benzonatate Other (See Comments)    Restless legs    Promethazine Other (See Comments)    Restless legs Restless legs   Review of Systems Objective:  There were no vitals filed for this visit.  General: Well developed, nourished, in no acute distress, alert and oriented x3   Dermatological: Skin is warm, dry and supple bilateral. Nails x 10 are well maintained; remaining integument appears  unremarkable at this time. There are no open sores, no preulcerative lesions, no rash or signs of infection present.  Nails and toes demonstrate contusion turf toe type lesion to the second digit of the left foot.  Multiple calluses along the medial aspect and plantar medial aspect of the hallux interphalangeal joint and first metatarsal phalangeal joint.  Surgical scar left ankle status post fracture internal fixation.  Vascular: Dorsalis Pedis artery and Posterior Tibial artery pedal pulses are 2/4 bilateral with immedate capillary fill time. Pedal hair growth present. No varicosities and no lower extremity edema present bilateral.   Neruologic: Grossly intact via light touch bilateral. Vibratory intact via tuning fork bilateral.  Protective threshold with Semmes Wienstein monofilament intact to all pedal sites bilateral. Patellar and Achilles deep tendon reflexes 2+ bilateral. No Babinski or clonus noted bilateral.   Musculoskeletal: No gross boney pedal deformities bilateral. No pain, crepitus, or limitation noted with foot and ankle range of motion bilateral. Muscular strength 5/5 in all groups tested bilateral.  Hallux interphalangeal with mild hallux valgus deformity left greater than right mild flexible hammertoe deformities 2 through 5 bilateral.  Gait: Unassisted, Nonantalgic.    Radiographs:  Radiographs taken today demonstrate osseously mature foot no significant osseous abnormalities other hallux interphalangeal hammertoe deformities and mild bunion deformity.  Assessment & Plan:   Assessment: Hallux interphalangeal bilateral mild bunion deformity bilateral hammertoe deformities flexor stabilization  Plan: Discussed etiology pathology conservative surgical therapies recommended he continue to keep the callosities smoothed down.  Recommended a longer wider shoe and also recommended that he follow-up with Trish.  I would like Trish to give him some recommendations of shoe gear as well as some custom built orthotics hopefully something he would be able to wear hiking and exercising in.     Heiress Williamson T. [place], [place]

## 2024-02-11 DIAGNOSIS — J3081 Allergic rhinitis due to animal (cat) (dog) hair and dander: Secondary | ICD-10-CM | POA: Diagnosis not present

## 2024-02-11 DIAGNOSIS — J3089 Other allergic rhinitis: Secondary | ICD-10-CM | POA: Diagnosis not present

## 2024-02-11 DIAGNOSIS — J301 Allergic rhinitis due to pollen: Secondary | ICD-10-CM | POA: Diagnosis not present

## 2024-02-12 DIAGNOSIS — F4323 Adjustment disorder with mixed anxiety and depressed mood: Secondary | ICD-10-CM | POA: Diagnosis not present

## 2024-02-12 DIAGNOSIS — E119 Type 2 diabetes mellitus without complications: Secondary | ICD-10-CM | POA: Diagnosis not present

## 2024-02-12 DIAGNOSIS — E162 Hypoglycemia, unspecified: Secondary | ICD-10-CM | POA: Diagnosis not present

## 2024-02-13 ENCOUNTER — Telehealth: Payer: Self-pay

## 2024-02-13 NOTE — Telephone Encounter (Signed)
 LVM to resched 6/20 appt// provider out of office

## 2024-02-26 DIAGNOSIS — F4323 Adjustment disorder with mixed anxiety and depressed mood: Secondary | ICD-10-CM | POA: Diagnosis not present

## 2024-02-28 DIAGNOSIS — J3081 Allergic rhinitis due to animal (cat) (dog) hair and dander: Secondary | ICD-10-CM | POA: Diagnosis not present

## 2024-02-28 DIAGNOSIS — J3089 Other allergic rhinitis: Secondary | ICD-10-CM | POA: Diagnosis not present

## 2024-02-28 DIAGNOSIS — J301 Allergic rhinitis due to pollen: Secondary | ICD-10-CM | POA: Diagnosis not present

## 2024-03-12 ENCOUNTER — Other Ambulatory Visit: Payer: Self-pay | Admitting: Family Medicine

## 2024-03-13 DIAGNOSIS — J3081 Allergic rhinitis due to animal (cat) (dog) hair and dander: Secondary | ICD-10-CM | POA: Diagnosis not present

## 2024-03-13 DIAGNOSIS — J301 Allergic rhinitis due to pollen: Secondary | ICD-10-CM | POA: Diagnosis not present

## 2024-03-13 DIAGNOSIS — J3089 Other allergic rhinitis: Secondary | ICD-10-CM | POA: Diagnosis not present

## 2024-03-14 ENCOUNTER — Other Ambulatory Visit

## 2024-03-20 DIAGNOSIS — J3081 Allergic rhinitis due to animal (cat) (dog) hair and dander: Secondary | ICD-10-CM | POA: Diagnosis not present

## 2024-03-20 DIAGNOSIS — J301 Allergic rhinitis due to pollen: Secondary | ICD-10-CM | POA: Diagnosis not present

## 2024-03-20 DIAGNOSIS — J3089 Other allergic rhinitis: Secondary | ICD-10-CM | POA: Diagnosis not present

## 2024-04-03 DIAGNOSIS — J3081 Allergic rhinitis due to animal (cat) (dog) hair and dander: Secondary | ICD-10-CM | POA: Diagnosis not present

## 2024-04-03 DIAGNOSIS — J3089 Other allergic rhinitis: Secondary | ICD-10-CM | POA: Diagnosis not present

## 2024-04-03 DIAGNOSIS — J301 Allergic rhinitis due to pollen: Secondary | ICD-10-CM | POA: Diagnosis not present

## 2024-04-10 DIAGNOSIS — J301 Allergic rhinitis due to pollen: Secondary | ICD-10-CM | POA: Diagnosis not present

## 2024-04-10 DIAGNOSIS — J3081 Allergic rhinitis due to animal (cat) (dog) hair and dander: Secondary | ICD-10-CM | POA: Diagnosis not present

## 2024-04-10 DIAGNOSIS — J3089 Other allergic rhinitis: Secondary | ICD-10-CM | POA: Diagnosis not present

## 2024-04-14 ENCOUNTER — Other Ambulatory Visit: Payer: Self-pay | Admitting: Family Medicine

## 2024-04-17 DIAGNOSIS — J3089 Other allergic rhinitis: Secondary | ICD-10-CM | POA: Diagnosis not present

## 2024-04-17 DIAGNOSIS — J301 Allergic rhinitis due to pollen: Secondary | ICD-10-CM | POA: Diagnosis not present

## 2024-04-17 DIAGNOSIS — J3081 Allergic rhinitis due to animal (cat) (dog) hair and dander: Secondary | ICD-10-CM | POA: Diagnosis not present

## 2024-04-30 DIAGNOSIS — J301 Allergic rhinitis due to pollen: Secondary | ICD-10-CM | POA: Diagnosis not present

## 2024-04-30 DIAGNOSIS — J3081 Allergic rhinitis due to animal (cat) (dog) hair and dander: Secondary | ICD-10-CM | POA: Diagnosis not present

## 2024-04-30 DIAGNOSIS — J3089 Other allergic rhinitis: Secondary | ICD-10-CM | POA: Diagnosis not present

## 2024-05-15 DIAGNOSIS — J3089 Other allergic rhinitis: Secondary | ICD-10-CM | POA: Diagnosis not present

## 2024-05-15 DIAGNOSIS — J3081 Allergic rhinitis due to animal (cat) (dog) hair and dander: Secondary | ICD-10-CM | POA: Diagnosis not present

## 2024-05-15 DIAGNOSIS — J301 Allergic rhinitis due to pollen: Secondary | ICD-10-CM | POA: Diagnosis not present

## 2024-05-22 DIAGNOSIS — J3081 Allergic rhinitis due to animal (cat) (dog) hair and dander: Secondary | ICD-10-CM | POA: Diagnosis not present

## 2024-05-22 DIAGNOSIS — J3089 Other allergic rhinitis: Secondary | ICD-10-CM | POA: Diagnosis not present

## 2024-05-22 DIAGNOSIS — J301 Allergic rhinitis due to pollen: Secondary | ICD-10-CM | POA: Diagnosis not present

## 2024-05-29 DIAGNOSIS — J301 Allergic rhinitis due to pollen: Secondary | ICD-10-CM | POA: Diagnosis not present

## 2024-05-29 DIAGNOSIS — J3089 Other allergic rhinitis: Secondary | ICD-10-CM | POA: Diagnosis not present

## 2024-05-29 DIAGNOSIS — J3081 Allergic rhinitis due to animal (cat) (dog) hair and dander: Secondary | ICD-10-CM | POA: Diagnosis not present

## 2024-06-10 DIAGNOSIS — J3081 Allergic rhinitis due to animal (cat) (dog) hair and dander: Secondary | ICD-10-CM | POA: Diagnosis not present

## 2024-06-10 DIAGNOSIS — J3089 Other allergic rhinitis: Secondary | ICD-10-CM | POA: Diagnosis not present

## 2024-06-10 DIAGNOSIS — J301 Allergic rhinitis due to pollen: Secondary | ICD-10-CM | POA: Diagnosis not present

## 2024-06-11 DIAGNOSIS — R509 Fever, unspecified: Secondary | ICD-10-CM | POA: Diagnosis not present

## 2024-06-11 DIAGNOSIS — H6692 Otitis media, unspecified, left ear: Secondary | ICD-10-CM | POA: Diagnosis not present

## 2024-06-11 DIAGNOSIS — U071 COVID-19: Secondary | ICD-10-CM | POA: Diagnosis not present

## 2024-06-19 DIAGNOSIS — J3081 Allergic rhinitis due to animal (cat) (dog) hair and dander: Secondary | ICD-10-CM | POA: Diagnosis not present

## 2024-06-19 DIAGNOSIS — J3089 Other allergic rhinitis: Secondary | ICD-10-CM | POA: Diagnosis not present

## 2024-06-19 DIAGNOSIS — J301 Allergic rhinitis due to pollen: Secondary | ICD-10-CM | POA: Diagnosis not present

## 2024-06-26 DIAGNOSIS — J3089 Other allergic rhinitis: Secondary | ICD-10-CM | POA: Diagnosis not present

## 2024-06-26 DIAGNOSIS — J3081 Allergic rhinitis due to animal (cat) (dog) hair and dander: Secondary | ICD-10-CM | POA: Diagnosis not present

## 2024-06-26 DIAGNOSIS — J301 Allergic rhinitis due to pollen: Secondary | ICD-10-CM | POA: Diagnosis not present

## 2024-07-03 DIAGNOSIS — J3081 Allergic rhinitis due to animal (cat) (dog) hair and dander: Secondary | ICD-10-CM | POA: Diagnosis not present

## 2024-07-03 DIAGNOSIS — J3089 Other allergic rhinitis: Secondary | ICD-10-CM | POA: Diagnosis not present

## 2024-07-03 DIAGNOSIS — J301 Allergic rhinitis due to pollen: Secondary | ICD-10-CM | POA: Diagnosis not present

## 2024-07-11 ENCOUNTER — Ambulatory Visit: Admitting: Family Medicine

## 2024-07-11 ENCOUNTER — Encounter: Payer: Self-pay | Admitting: Family Medicine

## 2024-07-11 VITALS — BP 109/69 | HR 52 | Ht 73.75 in | Wt 181.2 lb

## 2024-07-11 DIAGNOSIS — E119 Type 2 diabetes mellitus without complications: Secondary | ICD-10-CM

## 2024-07-11 DIAGNOSIS — E7800 Pure hypercholesterolemia, unspecified: Secondary | ICD-10-CM

## 2024-07-11 DIAGNOSIS — E781 Pure hyperglyceridemia: Secondary | ICD-10-CM

## 2024-07-11 DIAGNOSIS — Z7984 Long term (current) use of oral hypoglycemic drugs: Secondary | ICD-10-CM | POA: Diagnosis not present

## 2024-07-11 DIAGNOSIS — Z23 Encounter for immunization: Secondary | ICD-10-CM | POA: Diagnosis not present

## 2024-07-11 DIAGNOSIS — I10 Essential (primary) hypertension: Secondary | ICD-10-CM | POA: Diagnosis not present

## 2024-07-11 LAB — POCT GLYCOSYLATED HEMOGLOBIN (HGB A1C)
HbA1c POC (<> result, manual entry): 5.8 % (ref 4.0–5.6)
HbA1c, POC (controlled diabetic range): 5.8 % (ref 0.0–7.0)
HbA1c, POC (prediabetic range): 5.8 % (ref 5.7–6.4)
Hemoglobin A1C: 5.8 % — AB (ref 4.0–5.6)

## 2024-07-11 NOTE — Progress Notes (Signed)
 OFFICE VISIT  07/11/2024  CC:  Chief Complaint  Patient presents with   Medical Management of Chronic Issues    Patient is a 48 y.o. male who presents for 81-month follow-up diabetes, hypertension, and hypertriglyceridemia A/P as of last visit: #1 diabetes without complication.  Excellent control. Point-of-care hemoglobin A1c today is 5.6%. Continue metformin  XR 500 mg twice daily. Feet exam normal today. Urine microalbumin/creatinine today.   #2 allergic rhinosinusitis.  He has a bit of bronchospasm cough. Depo-Medrol  80 mg IM today. Continue once daily nonsedating antihistamine and once daily nasal steroid. Albuterol  inhaler prescribed, 1 to 2 puffs every 6 hours as needed.   #3 hypertension, well-controlled on HCTZ 12.5 mg a day and losartan  25 mg a day. Electrolytes and creatinine monitoring today.   4.  Hypertriglyceridemia. Continue fenofibrate  160 mg a day. Lipid panel today (he last ate approximately 4 hours ago).  INTERIM HX: Labs all normal last visit. Ash is feeling well.  He is exercising quite a bit.  He had COVID infection a few weeks ago and did not exercise much during that time.  Overall his diet has been good.  No acute concerns.  He continues to see his psychiatrist and continues on Lamictal 100 mg a day and his BuSpar is now at 12.5 mg twice a day.   Past Medical History:  Diagnosis Date   Alcoholism in recovery [facility])    quit 08/2019 (rehab)   Allergic rhinitis    Bipolar II disorder (HCC)    Diabetes mellitus without complication (HCC)    Elevated liver enzymes    hx of, when he was drinking ETOH heavily   GERD (gastroesophageal reflux disease)    resolved with Nissen/HH surgery 2022   History of pneumonia    Hyperlipidemia    Hypertension     Past Surgical History:  Procedure Laterality Date   ESOPHAGOGASTRODUODENOSCOPY     2021 [facility]   HIATAL HERNIA REPAIR N/A 04/07/2021   Procedure: LAPAROSCOPIC HIATAL  HERNIA REPAIR WITH NISSEN FUNDOPLICATION AND GASTRORRHAPHY;  Surgeon: Tanda Locus, MD;  Location: [facility];  Service: General;  Laterality: N/A;   left leg surgery     SINUSOTOMY     TONSILLECTOMY AND ADENOIDECTOMY  1995   UPPER GI ENDOSCOPY  04/07/2021   Procedure: UPPER GI ENDOSCOPY;  Surgeon: Tanda Locus, MD;  Location: [facility];  Service: General;;   WRIST FRACTURE SURGERY      Outpatient Medications Prior to Visit  Medication Sig Dispense Refill   BUSPIRONE HCL PO Take 12.5 mg by mouth 2 (two) times daily.     fenofibrate  160 MG tablet Take 1 tablet (160 mg total) by mouth daily. 90 tablet 3   hydrochlorothiazide  (MICROZIDE ) 12.5 MG capsule Take 1 capsule (12.5 mg total) by mouth daily. 90 capsule 3   lamoTRIgine (LAMICTAL) 100 MG tablet Take 100 mg by mouth daily.     levocetirizine (XYZAL ) 5 MG tablet TAKE 1 TABLET BY MOUTH EVERY DAY IN THE EVENING 30 tablet 2   losartan  (COZAAR ) 25 MG tablet Take 1 tablet (25 mg total) by mouth daily. 90 tablet 3   metFORMIN  (GLUCOPHAGE -XR) 500 MG 24 hr tablet TAKE 1 TABLET BY MOUTH TWICE A DAY 180 tablet 1   oxybutynin  (DITROPAN -XL) 10 MG 24 hr tablet Take 1 tablet (10 mg total) by mouth at bedtime. 90 tablet 3   albuterol  (VENTOLIN  HFA) 108 (90 Base) MCG/ACT inhaler Inhale 2 puffs into the lungs every 6 (six) hours  as needed for wheezing or shortness of breath. 8 g 0   busPIRone (BUSPAR) 7.5 MG tablet Take 7.5 mg by mouth daily.     fluticasone  (FLONASE ) 50 MCG/ACT nasal spray SPRAY 2 SPRAYS INTO EACH NOSTRIL EVERY DAY 16 mL 11   predniSONE  (DELTASONE ) 10 MG tablet 3 tabs daily x 3 days, then 2 tabs daily x 3 days, then 1 tab daily x 3 days 18 tablet 0   No facility-administered medications prior to visit.    Allergies  Allergen Reactions   Benzonatate Other (See Comments)    Restless legs    Promethazine Other (See Comments)    Restless legs Restless legs    Review of Systems As per HPI  PE:    07/11/2024    2:29 PM 12/31/2023     3:06 PM 05/21/2023    2:31 PM  Vitals with BMI  Height 6' 1.75 6' 1.75 6' 1.75  Weight 181 lbs 3 oz 182 lbs 10 oz 189 lbs 3 oz  BMI 23.43 23.61 24.46  Systolic 109 137 867  Diastolic 69 78 85  Pulse 52 62 72    Physical Exam  Gen: Alert, well appearing.  Patient is oriented to person, place, time, and situation. AFFECT: pleasant, lucid thought and speech. CV: RRR, no m/r/g.   LUNGS: CTA bilat, nonlabored resps, good aeration in all lung fields. EXT: no clubbing or cyanosis.  no edema.    LABS:  Last CBC Lab Results  Component Value Date   WBC 9.9 12/31/2023   HGB 13.9 12/31/2023   HCT 39.8 12/31/2023   MCV 90.1 12/31/2023   MCH 29.1 04/08/2021   RDW 13.6 12/31/2023   PLT 230.0 12/31/2023   Last metabolic panel Lab Results  Component Value Date   GLUCOSE 95 12/31/2023   NA 140 12/31/2023   K 3.8 12/31/2023   CL 99 12/31/2023   CO2 29 12/31/2023   BUN 21 12/31/2023   CREATININE 0.91 12/31/2023   GFR 99.62 12/31/2023   CALCIUM 9.8 12/31/2023   PROT 7.6 12/31/2023   ALBUMIN 5.2 12/31/2023   BILITOT 0.4 12/31/2023   ALKPHOS 44 12/31/2023   AST 25 12/31/2023   ALT 17 12/31/2023   ANIONGAP 7 04/08/2021   Last lipids Lab Results  Component Value Date   CHOL 151 12/31/2023   HDL 91.00 12/31/2023   LDLCALC 50 12/31/2023   LDLDIRECT 83.0 05/12/2020   TRIG 47.0 12/31/2023   CHOLHDL 2 12/31/2023   Last hemoglobin A1c Lab Results  Component Value Date   HGBA1C 5.8 (A) 07/11/2024   HGBA1C 5.8 07/11/2024   HGBA1C 5.8 07/11/2024   HGBA1C 5.8 07/11/2024   Last thyroid  functions Lab Results  Component Value Date   TSH 1.27 12/31/2023   IMPRESSION AND PLAN:  #1 diabetes without complication.  Point-of-care hemoglobin A1c today is 5.8% Well-controlled on metformin  XR 500 mg twice daily. Renal function today.  2.  Hypertension, well-controlled on HCTZ 12.5 mg/day and losartan  25 mg a day. Check electrolytes and creatinine today.  3.   Hypertriglyceridemia. Doing well on fenofibrate  160 mg daily. Triglycerides excellent (47) on labs 6 months ago.  Plan repeat in 6 months.  An After Visit Summary was printed and given to the patient.  FOLLOW UP: Return in about 6 months (around 01/09/2025) for annual CPE (fasting). Next CPE April 2026 Signed:  Gerlene Hockey, MD           07/11/2024

## 2024-07-12 ENCOUNTER — Ambulatory Visit: Payer: Self-pay | Admitting: Family Medicine

## 2024-07-12 LAB — BASIC METABOLIC PANEL WITH GFR
BUN: 17 mg/dL (ref 7–25)
CO2: 33 mmol/L — ABNORMAL HIGH (ref 20–32)
Calcium: 9.9 mg/dL (ref 8.6–10.3)
Chloride: 98 mmol/L (ref 98–110)
Creat: 1.01 mg/dL (ref 0.60–1.29)
Glucose, Bld: 93 mg/dL (ref 65–99)
Potassium: 4.4 mmol/L (ref 3.5–5.3)
Sodium: 139 mmol/L (ref 135–146)
eGFR: 91 mL/min/1.73m2 (ref >=60)

## 2024-07-19 ENCOUNTER — Other Ambulatory Visit: Payer: Self-pay | Admitting: Family Medicine

## 2024-07-23 DIAGNOSIS — J3081 Allergic rhinitis due to animal (cat) (dog) hair and dander: Secondary | ICD-10-CM | POA: Diagnosis not present

## 2024-07-23 DIAGNOSIS — J301 Allergic rhinitis due to pollen: Secondary | ICD-10-CM | POA: Diagnosis not present

## 2024-07-23 DIAGNOSIS — J3089 Other allergic rhinitis: Secondary | ICD-10-CM | POA: Diagnosis not present

## 2024-07-24 DIAGNOSIS — J301 Allergic rhinitis due to pollen: Secondary | ICD-10-CM | POA: Diagnosis not present

## 2024-07-24 DIAGNOSIS — J3089 Other allergic rhinitis: Secondary | ICD-10-CM | POA: Diagnosis not present

## 2024-07-24 DIAGNOSIS — J3081 Allergic rhinitis due to animal (cat) (dog) hair and dander: Secondary | ICD-10-CM | POA: Diagnosis not present

## 2024-08-07 DIAGNOSIS — J3089 Other allergic rhinitis: Secondary | ICD-10-CM | POA: Diagnosis not present

## 2024-08-07 DIAGNOSIS — J301 Allergic rhinitis due to pollen: Secondary | ICD-10-CM | POA: Diagnosis not present

## 2024-08-07 DIAGNOSIS — J3081 Allergic rhinitis due to animal (cat) (dog) hair and dander: Secondary | ICD-10-CM | POA: Diagnosis not present

## 2024-08-08 NOTE — Progress Notes (Signed)
 Kyle [name]                                          MRN: [id number]   08/08/2024   The [facility] Quality Team Specialist reviewed this patient medical record for the purposes of chart review for care gap closure. The following were reviewed: abstraction for care gap closure-kidney health evaluation for diabetes:eGFR  and uACR.    [facility] Quality Team

## 2024-08-19 DIAGNOSIS — J3089 Other allergic rhinitis: Secondary | ICD-10-CM | POA: Diagnosis not present

## 2024-08-19 DIAGNOSIS — J3081 Allergic rhinitis due to animal (cat) (dog) hair and dander: Secondary | ICD-10-CM | POA: Diagnosis not present

## 2024-08-19 DIAGNOSIS — J301 Allergic rhinitis due to pollen: Secondary | ICD-10-CM | POA: Diagnosis not present

## 2024-08-30 ENCOUNTER — Other Ambulatory Visit: Payer: Self-pay | Admitting: Family Medicine

## 2024-09-02 DIAGNOSIS — J3081 Allergic rhinitis due to animal (cat) (dog) hair and dander: Secondary | ICD-10-CM | POA: Diagnosis not present

## 2024-09-02 DIAGNOSIS — J3089 Other allergic rhinitis: Secondary | ICD-10-CM | POA: Diagnosis not present

## 2024-09-02 DIAGNOSIS — J301 Allergic rhinitis due to pollen: Secondary | ICD-10-CM | POA: Diagnosis not present

## 2024-09-16 DIAGNOSIS — J3081 Allergic rhinitis due to animal (cat) (dog) hair and dander: Secondary | ICD-10-CM | POA: Diagnosis not present

## 2024-09-16 DIAGNOSIS — J301 Allergic rhinitis due to pollen: Secondary | ICD-10-CM | POA: Diagnosis not present

## 2024-09-16 DIAGNOSIS — J3089 Other allergic rhinitis: Secondary | ICD-10-CM | POA: Diagnosis not present

## 2025-01-15 ENCOUNTER — Encounter: Admitting: Family Medicine
